# Patient Record
Sex: Female | Born: 1975
Health system: Southern US, Community
[De-identification: ages and names within clinical notes are randomized; demographics above are authoritative.]

## PROBLEM LIST (undated history)

## (undated) DIAGNOSIS — K219 Gastro-esophageal reflux disease without esophagitis: Secondary | ICD-10-CM

## (undated) DIAGNOSIS — E7800 Pure hypercholesterolemia, unspecified: Secondary | ICD-10-CM

## (undated) DIAGNOSIS — G43909 Migraine, unspecified, not intractable, without status migrainosus: Secondary | ICD-10-CM

## (undated) DIAGNOSIS — K589 Irritable bowel syndrome without diarrhea: Secondary | ICD-10-CM

## (undated) DIAGNOSIS — J4520 Mild intermittent asthma, uncomplicated: Secondary | ICD-10-CM

## (undated) HISTORY — DX: Mild intermittent asthma, uncomplicated: J45.20

## (undated) HISTORY — DX: Pure hypercholesterolemia, unspecified: E78.00

## (undated) HISTORY — DX: Irritable bowel syndrome, unspecified: K58.9

## (undated) HISTORY — DX: Migraine, unspecified, not intractable, without status migrainosus: G43.909

## (undated) HISTORY — DX: Gastro-esophageal reflux disease without esophagitis: K21.9

---

## 1994-05-03 HISTORY — PX: WISDOM TOOTH EXTRACTION: SHX21

## 2012-09-27 ENCOUNTER — Other Ambulatory Visit: Payer: Self-pay | Admitting: Adult Health

## 2013-03-22 NOTE — H&P (Signed)
  NTS SOAP Note  Vital Signs:  Vitals as of: 03/22/2013: Systolic 122: Diastolic 79: Heart Rate 68: Temp 97.50F: Height 34ft 6in: Weight 205Lbs 0 Ounces: BMI 33.09  BMI : 33.09 kg/m2  Subjective: This 37 Years 62 Months old Female presents for of hemorrhoidal disease.  Has several external hemorrhoidal skin tags present.  Difficult to keep herself clean.  Occassionally get irritated.  Never has had hemorrhoid surgery.  Review of Symptoms:  Constitutional:  fatigue Head:unremarkable    Eyes:unremarkable   Nose/Mouth/Throat:unremarkable Cardiovascular:  unremarkable   Respiratory:  dyspnea Gastrointestinal:  unremarkable   Genitourinary:unremarkable       joint, neck, back pain dry skin Hematolgic/Lymphatic:unremarkable     Allergic/Immunologic:unremarkable     Past Medical History:    Reviewed   Past Medical History  Surgical History: c sections Medical Problems: none Allergies: nkda Medications: none   Social History:Reviewed  Social History  Preferred Language: English Race:  White Ethnicity: Not Hispanic / Latino Age: 37 Years 6 Months Marital Status:  S   Smoking Status: Never smoker reviewed on 03/22/2013 Functional Status reviewed on mm/dd/yyyy ------------------------------------------------ Bathing: Normal Cooking: Normal Dressing: Normal Driving: Normal Eating: Normal Managing Meds: Normal Oral Care: Normal Shopping: Normal Toileting: Normal Transferring: Normal Walking: Normal Cognitive Status reviewed on mm/dd/yyyy ------------------------------------------------ Attention: Normal Decision Making: Normal Language: Normal Memory: Normal Motor: Normal Perception: Normal Problem Solving: Normal Visual and Spatial: Normal   Family History:  Reviewed  Family Health History Mother, Living; Healthy; healthy Father, Deceased; Healthy; amyloidosis    Objective Information: General:  Well  appearing, well nourished in no distress. Heart:  RRR, no murmur Lungs:    CTA bilaterally, no wheezes, rhonchi, rales.  Breathing unlabored. Abdomen:Soft, NT/ND, no HSM, no masses.   Two prominent external hemorrhoidal skin tags present at 12 and 8 o'clock positions.  No protruding internall hemorrhoids noted.  Assessment:External hemorrhoidal skin tags  Diagnosis &amp; Procedure Smart Code   Plan:Scheduled for excision of hemorrhoidal disease on 04/11/13.   Patient Education:Alternative treatments to surgery were discussed with patient (and family).  Risks and benefits  of procedure including bleeding and recurrence were fully explained to the patient (and family) who gave informed consent. Patient/family questions were addressed.  Follow-up:Pending Surgery

## 2013-03-28 ENCOUNTER — Encounter (HOSPITAL_COMMUNITY): Payer: Self-pay

## 2013-04-04 NOTE — Patient Instructions (Addendum)
Holly Yang  04/04/2013   Your procedure is scheduled on:  04/09/13  Report to [facility] at 10:00 AM.  Call this number if you have problems the morning of surgery: [phone]   Remember:   Do not eat food or drink liquids after midnight.   Take these medicines the morning of surgery with A SIP OF WATER: [name] (birth control).  Do not wear jewelry, make-up or nail polish.  Do not wear lotions, powders, or perfumes.   Do not shave 48 hours prior to surgery. Men may shave face and neck.  Do not bring valuables to the hospital.  [facility] is not responsible for any belongings or valuables.               Contacts, dentures or bridgework may not be worn into surgery.  Leave suitcase in the car. After surgery it may be brought to your room.  For patients admitted to the hospital, discharge time is determined by your treatment team.               Patients discharged the day of surgery will not be allowed to drive home.   Special Instructions: Shower using CHG 2 nights before surgery and the night before surgery.  If you shower the day of surgery use CHG.  Use special wash - you have one bottle of CHG for all showers.  You should use approximately 1/3 of the bottle for each shower.   Please read over the following fact sheets that you were given: Pain Booklet, Surgical Site Infection Prevention, Anesthesia Post-op Instructions and Care and Recovery After Surgery    Hemorrhoidectomy Hemorrhoidectomy is surgery to remove hemorrhoids. Hemorrhoids are veins that have become swollen in the rectum. The rectum is the area from the bottom end of the intestines to the opening where bowel movements leave the body. Hemorrhoids can be uncomfortable. They can cause itching, bleeding and pain if a blood clot forms in them (thrombose). If hemorrhoids are small, surgery may not be needed. But if they cover a larger area, surgery is usually suggested.  LET YOUR CAREGIVER KNOW ABOUT:   Any  allergies.  All medications you are taking, including:  Herbs, eyedrops, over-the-counter medications and creams.  Blood thinners (anticoagulants), aspirin or other drugs that could affect blood clotting.  Use of steroids (by mouth or as creams).  Previous problems with anesthetics, including local anesthetics.  Possibility of pregnancy, if this applies.  Any history of blood clots.  Any history of bleeding or other blood problems.  Previous surgery.  Smoking history.  Other health problems. RISKS AND COMPLICATIONS All surgery carries some risk. However, hemorrhoid surgery usually goes smoothly. Possible complications could include:  Urinary retention.  Bleeding.  Infection.  A painful incision.  A reaction to the anesthesia (this is not common). BEFORE THE PROCEDURE   Stop using aspirin and non-steroidal anti-inflammatory drugs (NSAIDs) for pain relief. This includes prescription drugs and over-the-counter drugs such as ibuprofen and naproxen. Also stop taking vitamin E. If possible, do this two weeks before your surgery.  If you take blood-thinners, ask your healthcare provider when you should stop taking them.  You will probably have blood and urine tests done several days before your surgery.  Do not eat or drink for about 8 hours before the surgery.  Arrive at least an hour before the surgery, or whenever your surgeon recommends. This will give you time to check in and fill out any needed paperwork.  Hemorrhoidectomy is  often an outpatient procedure. This means you will be able to go home the same day. Sometimes, though, people stay overnight in the hospital after the procedure. Ask your surgeon what to expect. Either way, make arrangements in advance for someone to drive you home. PROCEDURE   The preparation:  You will change into a hospital gown.  You will be given an IV. A needle will be inserted in your arm. Medication can flow directly into your body  through this needle.  You might be given an enema to clear your rectum.  Once in the operating room, you will probably lie on your side or be repositioned later to lying on your stomach.  You will be given anesthesia (medication) so you will not feel anything during the surgery. The surgery often is done with local anesthesia (the area near the hemorrhoids will be numb and you will be drowsy but awake). Sometimes, general anesthesia is used (you will be asleep during the procedure).  The procedure:  There are a few different procedures for hemorrhoids. Be sure to ask you surgeon about the procedure, the risks and benefits.  Be sure to ask about what you need to do to take care of the wound, if there is one. AFTER THE PROCEDURE  You will stay in a recovery area until the anesthesia has worn off. Your blood pressure and pulse will be checked every so often.  You may feel a lot of pain in the area of the rectum.  Take all pain medication prescribed by your surgeon. Ask before taking any over-the-counter pain medicines.  Sometimes sitting in a warm bath can help relieve your pain.  To make sure you have bowel movements without straining:  You will probably need to take stool softeners (usually a pill) for a few days.  You should drink 8 to 10 glasses of water each day.  Your activity will be restricted for awhile. Ask your caregiver for a list of what you should and should not do while you recover. Document Released: 02/14/2009 Document Revised: 07/12/2011 Document Reviewed: 02/14/2009 [facility] Patient Information 2014 [place], [place].    PATIENT INSTRUCTIONS POST-ANESTHESIA  IMMEDIATELY FOLLOWING SURGERY:  Do not drive or operate machinery for the first twenty four hours after surgery.  Do not make any important decisions for twenty four hours after surgery or while taking narcotic pain medications or sedatives.  If you develop intractable nausea and vomiting or a severe headache  please notify your doctor immediately.  FOLLOW-UP:  Please make an appointment with your surgeon as instructed. You do not need to follow up with anesthesia unless specifically instructed to do so.  WOUND CARE INSTRUCTIONS (if applicable):  Keep a dry clean dressing on the anesthesia/puncture wound site if there is drainage.  Once the wound has quit draining you may leave it open to air.  Generally you should leave the bandage intact for twenty four hours unless there is drainage.  If the epidural site drains for more than 36-48 hours please call the anesthesia department.  QUESTIONS?:  Please feel free to call your physician or the hospital operator if you have any questions, and they will be happy to assist you.

## 2013-04-05 ENCOUNTER — Encounter (HOSPITAL_COMMUNITY)
Admission: RE | Admit: 2013-04-05 | Discharge: 2013-04-05 | Disposition: A | Discharge status: Home / Self Care | Payer: BC Managed Care – PPO | Source: Ambulatory Visit | Attending: General Surgery | Admitting: General Surgery

## 2013-04-05 ENCOUNTER — Encounter (HOSPITAL_COMMUNITY): Payer: Self-pay

## 2013-04-05 DIAGNOSIS — Z01818 Encounter for other preprocedural examination: Secondary | ICD-10-CM | POA: Insufficient documentation

## 2013-04-05 DIAGNOSIS — Z01812 Encounter for preprocedural laboratory examination: Secondary | ICD-10-CM | POA: Insufficient documentation

## 2013-04-05 LAB — BASIC METABOLIC PANEL
BUN: 12 mg/dL (ref 6–23)
Calcium: 9.1 mg/dL (ref 8.4–10.5)
Chloride: 101 mEq/L (ref 96–112)
Creatinine, Ser: 1.01 mg/dL (ref 0.50–1.10)
GFR calc Af Amer: 81 mL/min — ABNORMAL LOW (ref >90)
GFR calc non Af Amer: 70 mL/min — ABNORMAL LOW (ref >90)
Potassium: 3.8 mEq/L (ref 3.5–5.1)

## 2013-04-05 LAB — HCG, SERUM, QUALITATIVE: Preg, Serum: NEGATIVE

## 2013-04-05 LAB — CBC WITH DIFFERENTIAL/PLATELET
Basophils Absolute: 0 10*3/uL (ref 0.0–0.1)
Basophils Relative: 0 % (ref 0–1)
Hemoglobin: 13 g/dL (ref 12.0–15.0)
Lymphocytes Relative: 31 % (ref 12–46)
MCHC: 33.9 g/dL (ref 30.0–36.0)
Neutro Abs: 4.3 10*3/uL (ref 1.7–7.7)
Neutrophils Relative %: 64 % (ref 43–77)
RDW: 13.5 % (ref 11.5–15.5)
WBC: 6.7 10*3/uL (ref 4.0–10.5)

## 2013-04-11 ENCOUNTER — Encounter (HOSPITAL_COMMUNITY): Payer: BC Managed Care – PPO | Admitting: Anesthesiology

## 2013-04-11 ENCOUNTER — Ambulatory Visit (HOSPITAL_COMMUNITY)
Admission: RE | Admit: 2013-04-11 | Discharge: 2013-04-11 | Disposition: A | Discharge status: Home / Self Care | Payer: BC Managed Care – PPO | Source: Ambulatory Visit | Attending: General Surgery | Admitting: General Surgery

## 2013-04-11 ENCOUNTER — Encounter (HOSPITAL_COMMUNITY): Payer: Self-pay | Admitting: *Deleted

## 2013-04-11 ENCOUNTER — Ambulatory Visit (HOSPITAL_COMMUNITY): Payer: BC Managed Care – PPO | Admitting: Anesthesiology

## 2013-04-11 ENCOUNTER — Encounter (HOSPITAL_COMMUNITY)
Admission: RE | Disposition: A | Discharge status: Home / Self Care | Payer: Self-pay | Source: Ambulatory Visit | Attending: General Surgery

## 2013-04-11 DIAGNOSIS — K644 Residual hemorrhoidal skin tags: Secondary | ICD-10-CM | POA: Insufficient documentation

## 2013-04-11 HISTORY — PX: EXCISION OF SKIN TAG: SHX6270

## 2013-04-11 SURGERY — EXCISION, SKIN TAG
Anesthesia: General | Site: Rectum

## 2013-04-11 MED ORDER — MIDAZOLAM HCL 2 MG/2ML IJ SOLN
INTRAMUSCULAR | Status: AC
  Filled 2013-04-11: qty 2

## 2013-04-11 MED ORDER — CHLORHEXIDINE GLUCONATE 4 % EX LIQD: 1.0000 "application " | Freq: Once | CUTANEOUS | Status: DC

## 2013-04-11 MED ORDER — MIDAZOLAM HCL 2 MG/2ML IJ SOLN
1.0000 mg | INTRAMUSCULAR | Status: DC | PRN
  Administered 2013-04-11: 2 mg via INTRAVENOUS

## 2013-04-11 MED ORDER — ONDANSETRON HCL 4 MG/2ML IJ SOLN
4.0000 mg | Freq: Once | INTRAMUSCULAR | Status: AC
  Administered 2013-04-11: 4 mg via INTRAVENOUS

## 2013-04-11 MED ORDER — FENTANYL CITRATE 0.05 MG/ML IJ SOLN
INTRAMUSCULAR | Status: DC | PRN
  Administered 2013-04-11 (×3): 25 ug via INTRAVENOUS

## 2013-04-11 MED ORDER — LIDOCAINE HCL (PF) 1 % IJ SOLN
INTRAMUSCULAR | Status: AC
  Filled 2013-04-11: qty 5

## 2013-04-11 MED ORDER — METRONIDAZOLE IN NACL 5-0.79 MG/ML-% IV SOLN
INTRAVENOUS | Status: AC
  Filled 2013-04-11: qty 100

## 2013-04-11 MED ORDER — PROPOFOL 10 MG/ML IV BOLUS
INTRAVENOUS | Status: DC | PRN
  Administered 2013-04-11: 125 mg via INTRAVENOUS

## 2013-04-11 MED ORDER — BUPIVACAINE HCL (PF) 0.5 % IJ SOLN
INTRAMUSCULAR | Status: DC | PRN
  Administered 2013-04-11: 4 mL

## 2013-04-11 MED ORDER — FENTANYL CITRATE 0.05 MG/ML IJ SOLN
INTRAMUSCULAR | Status: AC
  Filled 2013-04-11: qty 2

## 2013-04-11 MED ORDER — BUPIVACAINE HCL (PF) 0.5 % IJ SOLN
INTRAMUSCULAR | Status: AC
  Filled 2013-04-11: qty 30

## 2013-04-11 MED ORDER — ONDANSETRON HCL 4 MG/2ML IJ SOLN
INTRAMUSCULAR | Status: AC
  Filled 2013-04-11: qty 2

## 2013-04-11 MED ORDER — 0.9 % SODIUM CHLORIDE (POUR BTL) OPTIME
TOPICAL | Status: DC | PRN
  Administered 2013-04-11: 1000 mL

## 2013-04-11 MED ORDER — FENTANYL CITRATE 0.05 MG/ML IJ SOLN
25.0000 ug | INTRAMUSCULAR | Status: AC
  Administered 2013-04-11 (×2): 25 ug via INTRAVENOUS

## 2013-04-11 MED ORDER — METRONIDAZOLE IN NACL 5-0.79 MG/ML-% IV SOLN
500.0000 mg | INTRAVENOUS | Status: AC
  Administered 2013-04-11: 500 mg via INTRAVENOUS

## 2013-04-11 MED ORDER — OXYCODONE-ACETAMINOPHEN 7.5-325 MG PO TABS: 1.0000 | ORAL_TABLET | ORAL | Status: DC | PRN

## 2013-04-11 MED ORDER — FENTANYL CITRATE 0.05 MG/ML IJ SOLN: 25.0000 ug | INTRAMUSCULAR | Status: DC | PRN

## 2013-04-11 MED ORDER — LACTATED RINGERS IV SOLN
INTRAVENOUS | Status: DC
  Administered 2013-04-11: 08:00:00 via INTRAVENOUS

## 2013-04-11 MED ORDER — LIDOCAINE VISCOUS 2 % MT SOLN
OROMUCOSAL | Status: DC | PRN
  Administered 2013-04-11: 20 mL

## 2013-04-11 MED ORDER — LIDOCAINE VISCOUS 2 % MT SOLN
OROMUCOSAL | Status: AC
  Filled 2013-04-11: qty 15

## 2013-04-11 MED ORDER — PROPOFOL 10 MG/ML IV EMUL
INTRAVENOUS | Status: AC
  Filled 2013-04-11: qty 20

## 2013-04-11 MED ORDER — ONDANSETRON HCL 4 MG/2ML IJ SOLN: 4.0000 mg | Freq: Once | INTRAMUSCULAR | Status: DC | PRN

## 2013-04-11 MED ORDER — LIDOCAINE HCL 1 % IJ SOLN
INTRAMUSCULAR | Status: DC | PRN
  Administered 2013-04-11: 30 mg via INTRADERMAL

## 2013-04-11 MED ORDER — HEMOSTATIC AGENTS (NO CHARGE) OPTIME
TOPICAL | Status: DC | PRN
  Administered 2013-04-11: 1 via TOPICAL

## 2013-04-11 MED ORDER — KETOROLAC TROMETHAMINE 30 MG/ML IJ SOLN
30.0000 mg | Freq: Once | INTRAMUSCULAR | Status: AC
  Administered 2013-04-11: 30 mg via INTRAVENOUS
  Filled 2013-04-11: qty 1

## 2013-04-11 SURGICAL SUPPLY — 30 items
BAG HAMPER (MISCELLANEOUS) ×2 IMPLANT
CLOTH BEACON ORANGE TIMEOUT ST (SAFETY) ×2 IMPLANT
COVER LIGHT HANDLE STERIS (MISCELLANEOUS) ×4 IMPLANT
COVER MAYO STAND XLG (DRAPE) ×2 IMPLANT
DECANTER SPIKE VIAL GLASS SM (MISCELLANEOUS) ×2 IMPLANT
DRAPE PROXIMA HALF (DRAPES) ×2 IMPLANT
ELECT REM PT RETURN 9FT ADLT (ELECTROSURGICAL) ×2
ELECTRODE REM PT RTRN 9FT ADLT (ELECTROSURGICAL) ×1 IMPLANT
GLOVE BIO SURGEON STRL SZ7.5 (GLOVE) ×2 IMPLANT
GLOVE BIOGEL PI IND STRL 7.0 (GLOVE) ×1 IMPLANT
GLOVE BIOGEL PI IND STRL 7.5 (GLOVE) ×1 IMPLANT
GLOVE BIOGEL PI INDICATOR 7.0 (GLOVE) ×1
GLOVE BIOGEL PI INDICATOR 7.5 (GLOVE) ×1
GLOVE ECLIPSE 7.0 STRL STRAW (GLOVE) ×2 IMPLANT
GLOVE EXAM NITRILE LRG STRL (GLOVE) ×2 IMPLANT
GLOVE SS BIOGEL STRL SZ 6.5 (GLOVE) ×1 IMPLANT
GLOVE SUPERSENSE BIOGEL SZ 6.5 (GLOVE) ×1
GOWN STRL REIN XL XLG (GOWN DISPOSABLE) ×4 IMPLANT
HEMOSTAT SURGICEL 4X8 (HEMOSTASIS) ×2 IMPLANT
KIT ROOM TURNOVER AP CYSTO (KITS) ×2 IMPLANT
MANIFOLD NEPTUNE II (INSTRUMENTS) ×2 IMPLANT
NEEDLE HYPO 25X1 1.5 SAFETY (NEEDLE) ×2 IMPLANT
NS IRRIG 1000ML POUR BTL (IV SOLUTION) ×2 IMPLANT
PACK PERI GYN (CUSTOM PROCEDURE TRAY) ×2 IMPLANT
PAD ARMBOARD 7.5X6 YLW CONV (MISCELLANEOUS) ×2 IMPLANT
SET BASIN LINEN APH (SET/KITS/TRAYS/PACK) ×2 IMPLANT
SPONGE GAUZE 4X4 12PLY (GAUZE/BANDAGES/DRESSINGS) ×2 IMPLANT
SURGILUBE 3G PEEL PACK STRL (MISCELLANEOUS) ×2 IMPLANT
SUT SILK 0 FSL (SUTURE) ×2 IMPLANT
SYR CONTROL 10ML LL (SYRINGE) ×2 IMPLANT

## 2013-04-11 NOTE — Anesthesia Preprocedure Evaluation (Signed)
Anesthesia Evaluation  Patient identified by MRN, date of birth, ID band Patient awake    Reviewed: Allergy & Precautions, H&P , NPO status , Patient's Chart, lab work & pertinent test results  History of Anesthesia Complications Negative for: history of anesthetic complications  Airway Mallampati: II  TM Distance: >3 FB     Dental  (+) Teeth Intact   Pulmonary neg pulmonary ROS,  breath sounds clear to auscultation        Cardiovascular negative cardio ROS  Rhythm:Regular Rate:Normal     Neuro/Psych    GI/Hepatic negative GI ROS,   Endo/Other    Renal/GU      Musculoskeletal   Abdominal   Peds  Hematology   Anesthesia Other Findings   Reproductive/Obstetrics                             Anesthesia Physical Anesthesia Plan  ASA: I  Anesthesia Plan: General   Post-op Pain Management:    Induction: Intravenous  Airway Management Planned: LMA  Additional Equipment:   Intra-op Plan:   Post-operative Plan: Extubation in OR  Informed Consent: I have reviewed the patients History and Physical, chart, labs and discussed the procedure including the risks, benefits and alternatives for the proposed anesthesia with the patient or authorized representative who has indicated his/her understanding and acceptance.     Plan Discussed with:   Anesthesia Plan Comments:         Anesthesia Quick Evaluation  

## 2013-04-11 NOTE — Op Note (Signed)
Patient:  Holly Yang  DOB:  08-May-1975  MRN:  [id number]   Preop Diagnosis:  External hemorrhoidal skin tags  Postop Diagnosis:  Same  Procedure:  Excision of hemorrhoidal skin tags  Surgeon:  Franky Macho, M.D.  Anes:  General  Indications:   Patient is a 37 year old white female presents with external hemorrhoidal skin tags the continue to be irritating. The patient now comes the operating room for excision of the hemorrhoidal skin tags. The risks and benefits of the procedure were fully explained to the patient, who gave informed consent.  Procedure note:  The patient was placed the supine position. After general anesthesia was administered, the perineum was prepped and draped using usual sterile technique with Betadine. Surgical site confirmation was performed.  A rectal examination revealed no significant internal hemorrhoidal disease. She had external hemorrhoidal skin tags at the 12:00 and 7:00 positions. Both were excised using the LigaSure. Both were disposed of. No abnormal bleeding was noted at the end of the procedure. 0.5% Sensorcaine was instilled the surrounding peritoneum. Surgicel and Viscous Xylocaine rectal packing was then placed.  All tape and needle counts were correct at the end of the procedure. Patient was awakened and transferred to PACU in stable condition.  Complications:  None  EBL:  None  Specimen:  None

## 2013-04-11 NOTE — Transfer of Care (Signed)
Immediate Anesthesia Transfer of Care Note  Patient: Holly Yang  Procedure(s) Performed: Procedure(s): EXCISION OF EXTERNAL HEMORRHOIDAL SKIN TAGS (N/A)  Patient Location: PACU  Anesthesia Type:General  Level of Consciousness: awake, alert  and oriented  Airway & Oxygen Therapy: Patient Spontanous Breathing and Patient connected to face mask oxygen  Post-op Assessment: Report given to PACU RN  Post vital signs: Reviewed and stable  Complications: No apparent anesthesia complications

## 2013-04-11 NOTE — Anesthesia Procedure Notes (Signed)
Procedure Name: LMA Insertion Date/Time: 04/11/2013 8:45 AM Performed by: Glynn Octave E Pre-anesthesia Checklist: Patient identified, Patient being monitored, Emergency Drugs available, Timeout performed and Suction available Patient Re-evaluated:Patient Re-evaluated prior to inductionOxygen Delivery Method: Circle System Utilized Preoxygenation: Pre-oxygenation with 100% oxygen Intubation Type: IV induction Ventilation: Mask ventilation without difficulty LMA: LMA inserted LMA Size: 4.0 Number of attempts: 1 Placement Confirmation: positive ETCO2 and breath sounds checked- equal and bilateral

## 2013-04-11 NOTE — Anesthesia Postprocedure Evaluation (Signed)
  Anesthesia Post-op Note  Patient: Holly Yang  Procedure(s) Performed: Procedure(s): EXCISION OF EXTERNAL HEMORRHOIDAL SKIN TAGS (N/A)  Patient Location: PACU  Anesthesia Type:General  Level of Consciousness: awake, alert  and oriented  Airway and Oxygen Therapy: Patient Spontanous Breathing and Patient connected to face mask oxygen  Post-op Pain: none  Post-op Assessment: Post-op Vital signs reviewed, Patient's Cardiovascular Status Stable, Respiratory Function Stable, Patent Airway and No signs of Nausea or vomiting  Post-op Vital Signs: Reviewed and stable  Complications: No apparent anesthesia complications

## 2013-04-11 NOTE — Interval H&P Note (Signed)
History and Physical Interval Note:  04/11/2013 8:29 AM  Holly Yang  has presented today for surgery, with the diagnosis of external hemorrhoidal skin tags  The various methods of treatment have been discussed with the patient and family. After consideration of risks, benefits and other options for treatment, the patient has consented to  Procedure(s): EXCISION OF EXTERNAL HEMORRHOIDAL SKIN TAGS (N/A) as a surgical intervention .  The patient's history has been reviewed, patient examined, no change in status, stable for surgery.  I have reviewed the patient's chart and labs.  Questions were answered to the patient's satisfaction.     Franky Macho A

## 2013-04-13 ENCOUNTER — Encounter (HOSPITAL_COMMUNITY): Payer: Self-pay | Admitting: General Surgery

## 2015-07-24 ENCOUNTER — Other Ambulatory Visit (HOSPITAL_COMMUNITY)
Admission: RE | Admit: 2015-07-24 | Discharge: 2015-07-24 | Disposition: A | Discharge status: Home / Self Care | Payer: 59 | Source: Ambulatory Visit | Attending: Internal Medicine | Admitting: Internal Medicine

## 2015-07-24 DIAGNOSIS — G43101 Migraine with aura, not intractable, with status migrainosus: Secondary | ICD-10-CM | POA: Diagnosis present

## 2015-07-24 DIAGNOSIS — R5383 Other fatigue: Secondary | ICD-10-CM | POA: Diagnosis present

## 2015-07-24 DIAGNOSIS — E559 Vitamin D deficiency, unspecified: Secondary | ICD-10-CM | POA: Diagnosis present

## 2015-07-24 LAB — CBC WITH DIFFERENTIAL/PLATELET
Basophils Absolute: 0 10*3/uL (ref 0.0–0.1)
Basophils Relative: 0 %
Eosinophils Absolute: 0.1 10*3/uL (ref 0.0–0.7)
Eosinophils Relative: 1 %
HCT: 41 % (ref 36.0–46.0)
Hemoglobin: 14.2 g/dL (ref 12.0–15.0)
LYMPHS ABS: 2.7 10*3/uL (ref 0.7–4.0)
LYMPHS PCT: 36 %
MCH: 31.6 pg (ref 26.0–34.0)
MCHC: 34.6 g/dL (ref 30.0–36.0)
MCV: 91.1 fL (ref 78.0–100.0)
MONO ABS: 0.4 10*3/uL (ref 0.1–1.0)
Monocytes Relative: 6 %
Neutro Abs: 4.2 10*3/uL (ref 1.7–7.7)
Neutrophils Relative %: 57 %
PLATELETS: 221 10*3/uL (ref 150–400)
RBC: 4.5 MIL/uL (ref 3.87–5.11)
RDW: 12.8 % (ref 11.5–15.5)
WBC: 7.5 10*3/uL (ref 4.0–10.5)

## 2015-07-24 LAB — COMPREHENSIVE METABOLIC PANEL
ALT: 25 U/L (ref 14–54)
AST: 31 U/L (ref 15–41)
Albumin: 4.1 g/dL (ref 3.5–5.0)
Alkaline Phosphatase: 57 U/L (ref 38–126)
Anion gap: 8 (ref 5–15)
BUN: 13 mg/dL (ref 6–20)
CHLORIDE: 102 mmol/L (ref 101–111)
CO2: 28 mmol/L (ref 22–32)
Calcium: 8.9 mg/dL (ref 8.9–10.3)
Creatinine, Ser: 0.85 mg/dL (ref 0.44–1.00)
GFR calc Af Amer: >60 mL/min (ref >60)
Glucose, Bld: 94 mg/dL (ref 65–99)
POTASSIUM: 3.9 mmol/L (ref 3.5–5.1)
Sodium: 138 mmol/L (ref 135–145)
TOTAL PROTEIN: 7.4 g/dL (ref 6.5–8.1)
Total Bilirubin: 0.5 mg/dL (ref 0.3–1.2)

## 2015-07-24 LAB — LIPID PANEL
CHOL/HDL RATIO: 3.7 ratio
Cholesterol: 219 mg/dL — ABNORMAL HIGH (ref 0–200)
HDL: 60 mg/dL (ref >40)
LDL Cholesterol: 130 mg/dL — ABNORMAL HIGH (ref 0–99)
TRIGLYCERIDES: 146 mg/dL (ref <150)
VLDL: 29 mg/dL (ref 0–40)

## 2015-07-24 LAB — T4, FREE: Free T4: 0.81 ng/dL (ref 0.61–1.12)

## 2015-07-24 LAB — TSH: TSH: 3.354 u[IU]/mL (ref 0.350–4.500)

## 2015-07-25 LAB — HEMOGLOBIN A1C
Hgb A1c MFr Bld: 5.5 % (ref 4.8–5.6)
Mean Plasma Glucose: 111 mg/dL

## 2015-07-25 LAB — VITAMIN D 25 HYDROXY (VIT D DEFICIENCY, FRACTURES): VIT D 25 HYDROXY: 47.5 ng/mL (ref 30.0–100.0)

## 2015-07-25 LAB — C-REACTIVE PROTEIN: CRP: 0.6 mg/dL (ref <1.0)

## 2015-07-30 LAB — MISC LABCORP TEST (SEND OUT): Labcorp test code: 600513

## 2015-12-08 ENCOUNTER — Other Ambulatory Visit (HOSPITAL_COMMUNITY): Payer: Self-pay | Admitting: Internal Medicine

## 2015-12-08 DIAGNOSIS — Z1231 Encounter for screening mammogram for malignant neoplasm of breast: Secondary | ICD-10-CM

## 2015-12-18 ENCOUNTER — Ambulatory Visit (HOSPITAL_COMMUNITY): Payer: 59

## 2015-12-29 ENCOUNTER — Other Ambulatory Visit: Payer: Self-pay | Admitting: Adult Health

## 2016-01-12 ENCOUNTER — Ambulatory Visit (HOSPITAL_COMMUNITY)
Admission: RE | Admit: 2016-01-12 | Discharge: 2016-01-12 | Disposition: A | Discharge status: Home / Self Care | Payer: 59 | Source: Ambulatory Visit | Attending: Internal Medicine | Admitting: Internal Medicine

## 2016-01-12 DIAGNOSIS — Z1231 Encounter for screening mammogram for malignant neoplasm of breast: Secondary | ICD-10-CM | POA: Diagnosis not present

## 2016-02-04 ENCOUNTER — Other Ambulatory Visit: Payer: Self-pay | Admitting: Adult Health

## 2016-02-05 ENCOUNTER — Ambulatory Visit (INDEPENDENT_AMBULATORY_CARE_PROVIDER_SITE_OTHER): Payer: 59 | Admitting: Adult Health

## 2016-02-05 ENCOUNTER — Other Ambulatory Visit (HOSPITAL_COMMUNITY)
Admission: RE | Admit: 2016-02-05 | Discharge: 2016-02-05 | Disposition: A | Discharge status: Home / Self Care | Payer: 59 | Source: Ambulatory Visit | Attending: Adult Health | Admitting: Adult Health

## 2016-02-05 ENCOUNTER — Encounter: Payer: Self-pay | Admitting: Adult Health

## 2016-02-05 VITALS — BP 100/72 | HR 76 | Ht 65.5 in | Wt 232.0 lb

## 2016-02-05 DIAGNOSIS — Z1212 Encounter for screening for malignant neoplasm of rectum: Secondary | ICD-10-CM

## 2016-02-05 DIAGNOSIS — Z01419 Encounter for gynecological examination (general) (routine) without abnormal findings: Secondary | ICD-10-CM

## 2016-02-05 DIAGNOSIS — Z1151 Encounter for screening for human papillomavirus (HPV): Secondary | ICD-10-CM | POA: Diagnosis not present

## 2016-02-05 LAB — HEMOCCULT GUIAC POC 1CARD (OFFICE): FECAL OCCULT BLD: NEGATIVE

## 2016-02-05 NOTE — Patient Instructions (Signed)
Physical in 1 year, pap in 3 if normal Mammogram yearly Labs with PCP

## 2016-02-05 NOTE — Progress Notes (Signed)
Patient ID: Holly Yang, female   DOB: 1975/05/14, 41 y.o.   MRN: [id number] History of Present Illness: Holly Yang is a 40 year old white female, single in for a well woman gyn exam and pap. PCP is Dr Maudie Mercury.   Current Medications, Allergies, Past Medical History, Past Surgical History, Family History and Social History were reviewed in [employer] record.     Review of Systems: Patient denies any hearing loss, fatigue, blurred vision, shortness of breath, chest pain, abdominal pain, problems with bowel movements, urination, or intercourse(not having sex). No joint pain or mood swings.Has recently started having migraines, seems to be with periods, is using imitrex.    Physical Exam:BP 100/72 (BP Location: Left Arm, Patient Position: Sitting, Cuff Size: Large)   Pulse 76   Ht 5' 5.5" (1.664 m)   Wt 232 lb (105.2 kg)   LMP 01/05/2016   BMI 38.02 kg/m  General:  Well developed, well nourished, no acute distress Skin:  Warm and dry Neck:  Midline trachea, normal thyroid, good ROM, no lymphadenopathy Lungs; Clear to auscultation bilaterally Breast:  No dominant palpable mass, retraction, or nipple discharge Cardiovascular: Regular rate and rhythm Abdomen:  Soft, non tender, no hepatosplenomegaly Pelvic:  External genitalia is normal in appearance, no lesions.  The vagina is normal in appearance. Urethra has no lesions or masses. The cervix is bulbous,Pap with HPV performed.  Uterus is felt to be normal size, shape, and contour.  No adnexal masses or tenderness noted.Bladder is non tender, no masses felt. Rectal: Good sphincter tone, no polyps, or hemorrhoids felt.  Hemoccult negative. Extremities/musculoskeletal:  No swelling or varicosities noted, no clubbing or cyanosis Psych:  No mood changes, alert and cooperative,seems happy PHQ 2 score 0  Impression:  1. Encounter for gynecological examination with Papanicolaou smear of cervix      Plan: Physical in 1  year, pap in 3 if normal Mammogram yearly Labs with PCPuse condoms if has sex Keep headache log, if really is related to periods, could try estrogen patch or low dose OC

## 2016-02-06 LAB — CYTOLOGY - PAP

## 2016-07-06 DIAGNOSIS — M9901 Segmental and somatic dysfunction of cervical region: Secondary | ICD-10-CM | POA: Diagnosis not present

## 2016-07-06 DIAGNOSIS — M9902 Segmental and somatic dysfunction of thoracic region: Secondary | ICD-10-CM | POA: Diagnosis not present

## 2016-07-06 DIAGNOSIS — M9903 Segmental and somatic dysfunction of lumbar region: Secondary | ICD-10-CM | POA: Diagnosis not present

## 2016-07-20 DIAGNOSIS — M9902 Segmental and somatic dysfunction of thoracic region: Secondary | ICD-10-CM | POA: Diagnosis not present

## 2016-07-20 DIAGNOSIS — M9901 Segmental and somatic dysfunction of cervical region: Secondary | ICD-10-CM | POA: Diagnosis not present

## 2016-07-20 DIAGNOSIS — M9903 Segmental and somatic dysfunction of lumbar region: Secondary | ICD-10-CM | POA: Diagnosis not present

## 2016-07-26 DIAGNOSIS — K589 Irritable bowel syndrome without diarrhea: Secondary | ICD-10-CM | POA: Insufficient documentation

## 2016-07-26 DIAGNOSIS — G43909 Migraine, unspecified, not intractable, without status migrainosus: Secondary | ICD-10-CM | POA: Insufficient documentation

## 2016-07-27 ENCOUNTER — Encounter: Payer: Self-pay | Admitting: Family Medicine

## 2016-07-27 ENCOUNTER — Ambulatory Visit (INDEPENDENT_AMBULATORY_CARE_PROVIDER_SITE_OTHER): Payer: 59 | Admitting: Family Medicine

## 2016-07-27 VITALS — BP 102/68 | HR 84 | Temp 97.1°F | Resp 16 | Ht 66.0 in | Wt 237.1 lb

## 2016-07-27 DIAGNOSIS — G43909 Migraine, unspecified, not intractable, without status migrainosus: Secondary | ICD-10-CM | POA: Diagnosis not present

## 2016-07-27 DIAGNOSIS — G479 Sleep disorder, unspecified: Secondary | ICD-10-CM | POA: Diagnosis not present

## 2016-07-27 DIAGNOSIS — Z7689 Persons encountering health services in other specified circumstances: Secondary | ICD-10-CM | POA: Diagnosis not present

## 2016-07-27 DIAGNOSIS — K580 Irritable bowel syndrome with diarrhea: Secondary | ICD-10-CM | POA: Diagnosis not present

## 2016-07-27 DIAGNOSIS — Z6838 Body mass index (BMI) 38.0-38.9, adult: Secondary | ICD-10-CM | POA: Diagnosis not present

## 2016-07-27 DIAGNOSIS — E6609 Other obesity due to excess calories: Secondary | ICD-10-CM

## 2016-07-27 DIAGNOSIS — R0600 Dyspnea, unspecified: Secondary | ICD-10-CM

## 2016-07-27 DIAGNOSIS — K219 Gastro-esophageal reflux disease without esophagitis: Secondary | ICD-10-CM | POA: Insufficient documentation

## 2016-07-27 HISTORY — DX: Gastro-esophageal reflux disease without esophagitis: K21.9

## 2016-07-27 LAB — LIPID PANEL
Cholesterol: 199 mg/dL (ref <200)
HDL: 61 mg/dL (ref >50)
LDL Cholesterol: 109 mg/dL — ABNORMAL HIGH (ref <100)
Total CHOL/HDL Ratio: 3.3 Ratio (ref <5.0)
Triglycerides: 145 mg/dL (ref <150)
VLDL: 29 mg/dL (ref <30)

## 2016-07-27 LAB — COMPREHENSIVE METABOLIC PANEL
ALK PHOS: 63 U/L (ref 33–115)
ALT: 20 U/L (ref 6–29)
AST: 19 U/L (ref 10–30)
Albumin: 4.1 g/dL (ref 3.6–5.1)
BUN: 14 mg/dL (ref 7–25)
CO2: 27 mmol/L (ref 20–31)
Calcium: 9.5 mg/dL (ref 8.6–10.2)
Chloride: 102 mmol/L (ref 98–110)
Creat: 0.94 mg/dL (ref 0.50–1.10)
GLUCOSE: 97 mg/dL (ref 65–99)
Potassium: 4.4 mmol/L (ref 3.5–5.3)
Sodium: 138 mmol/L (ref 135–146)
TOTAL PROTEIN: 6.9 g/dL (ref 6.1–8.1)
Total Bilirubin: 0.4 mg/dL (ref 0.2–1.2)

## 2016-07-27 LAB — URINALYSIS, ROUTINE W REFLEX MICROSCOPIC
Bilirubin Urine: NEGATIVE
Glucose, UA: NEGATIVE
Hgb urine dipstick: NEGATIVE
KETONES UR: NEGATIVE
Leukocytes, UA: NEGATIVE
NITRITE: NEGATIVE
PROTEIN: NEGATIVE
Specific Gravity, Urine: 1.018 (ref 1.001–1.035)
pH: 6 (ref 5.0–8.0)

## 2016-07-27 LAB — CBC
HCT: 39.8 % (ref 35.0–45.0)
Hemoglobin: 12.9 g/dL (ref 11.7–15.5)
MCH: 28.7 pg (ref 27.0–33.0)
MCHC: 32.4 g/dL (ref 32.0–36.0)
MCV: 88.6 fL (ref 80.0–100.0)
MPV: 10.1 fL (ref 7.5–12.5)
Platelets: 265 10*3/uL (ref 140–400)
RBC: 4.49 MIL/uL (ref 3.80–5.10)
RDW: 13.9 % (ref 11.0–15.0)
WBC: 6.9 10*3/uL (ref 3.8–10.8)

## 2016-07-27 LAB — TSH: TSH: 3.34 mIU/L

## 2016-07-27 NOTE — Patient Instructions (Signed)
drink plenty of water Eat a heart healthy diet Walk every day that you are able Add metamucil ( or generic) daily After one week take twice a day I am referring for home sleep study I am referring to neurology for migraine consult I am ordering a pulmonary function test Labs ordered See me in one month for follow up (40)

## 2016-07-27 NOTE — Progress Notes (Signed)
Chief Complaint  Patient presents with  . Establish Care   Patient is new to establish with this office Old records are requested Pap smear and mammogram are up-to-date She uses albuterol almost a daily basis. She has never had an abnormal PFT or been diagnosed with asthma. She was prescribed this for a vague feeling of shortness of breath. She did not have asthma as a child. Explained to her that she needs a pulmonary function test prior to using this medication. She takes Imitrex several days a month for headache. She has never seen a neurologist been diagnosed with migraine. She gets severe headaches that last for several days at a time. When she has these headaches she vomits. She is self diagnosed as having a migraine. These do not run in her family. They're not related to her menstrual period. She does not have visual symptoms. She is referred to neurology. She has irritable bowel syndrome with diarrhea. She takes medication daily. She has not to try dietary changes, probiotics, or fiber supplementation. It is recommended that she start Metamucil once, then twice a day until she returns to try to get off of the medication. She has GERD. She uses omeprazole when necessary. He complains of difficulty sleeping. She never feels that she sleeps well. She has trouble falling asleep. She wakes up all through the night. She awakens in the morning not feeling rested. She is tired during the day. She's never had a sleep study. This is ordered.   Patient Active Problem List   Diagnosis Date Noted  . Class 2 obesity due to excess calories without serious comorbidity with body mass index (BMI) of 38.0 to 38.9 in adult 07/27/2016  . GERD without esophagitis 07/27/2016  . IBS (irritable bowel syndrome) 07/26/2016  . Migraine headache 07/26/2016    Outpatient Encounter Prescriptions as of 07/27/2016  Medication Sig  . albuterol (PROVENTIL HFA;VENTOLIN HFA) 108 (90 Base) MCG/ACT inhaler Inhale 1 puff  into the lungs every 6 (six) hours as needed for wheezing or shortness of breath.  Marland Kitchen omeprazole (PRILOSEC) 40 MG capsule   . SUMAtriptan (IMITREX) 100 MG tablet Take 100 mg by mouth as needed.   Marland Kitchen VIBERZI 75 MG TABS daily.    No facility-administered encounter medications on file as of 07/27/2016.     Past Medical History:  Diagnosis Date  . GERD without esophagitis 07/27/2016  . Irritable bowel disease    diarrhea  . Migraines     Past Surgical History:  Procedure Laterality Date  . CESAREAN SECTION  2007, 2010   [facility]in, [place], [place], [place], [place]  . EXCISION OF SKIN TAG N/A 04/11/2013   Procedure: EXCISION OF EXTERNAL HEMORRHOIDAL SKIN TAGS;  Surgeon: Jamesetta So, MD;  Location: [facility];  Service: General;  Laterality: N/A;  . [place]    Social History   Social History  . Marital status: Single    Spouse name: N/A  . Number of children: 2  . Years of education: 16   Occupational History  . 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 Smoking status: Never Smoker  . Smokeless tobacco: Never Used  . Alcohol use No  . Drug use: No  . Sexual activity: Not Currently   Other Topics Concern  . Not on file   Social History Narrative   Biology degree / chemistry from [employer]   Lives at home alone  Children live at home with [place] with their fathers       Family History  Problem Relation Age of Onset  . Cancer Paternal Grandfather     skin,liver  . Stroke Paternal Grandmother   . Arthritis Maternal Grandmother   . Other Maternal Grandfather     hemochromatosis  . Cancer Father 34  . Congestive Heart Failure Mother   . Heart disease Mother   . Hypertension Mother     Review of Systems  Constitutional: Negative for chills, fever and weight loss.  HENT: Negative for congestion and hearing loss.   Eyes: Negative for blurred vision and pain.  Respiratory: Positive for shortness of  breath. Negative for cough.   Cardiovascular: Negative for chest pain and leg swelling.  Gastrointestinal: Positive for abdominal pain and diarrhea. Negative for constipation and heartburn.  Genitourinary: Negative for dysuria and frequency.  Musculoskeletal: Negative for falls, joint pain and myalgias.  Neurological: Positive for headaches. Negative for dizziness and seizures.  Psychiatric/Behavioral: Negative for depression. The patient has insomnia. The patient is not nervous/anxious.     BP 102/68 (BP Location: Right Arm, Patient Position: Sitting, Cuff Size: Large)   Pulse 84   Temp 97.1 F (36.2 C) (Temporal)   Resp 16   Ht 5' 6"  (1.676 m)   Wt 237 lb 1.3 oz (107.5 kg)   LMP 07/20/2016 (Exact Date)   SpO2 98%   BMI 38.27 kg/m   Physical Exam  Constitutional: She is oriented to person, place, and time. She appears well-developed and well-nourished.  Overweight  HENT:  Head: Normocephalic and atraumatic.  Right Ear: External ear normal.  Left Ear: External ear normal.  Mouth/Throat: Oropharynx is clear and moist.  Eyes: Conjunctivae are normal. Pupils are equal, round, and reactive to light.  Neck: Normal range of motion. Neck supple. No thyromegaly present.  Cardiovascular: Normal rate, regular rhythm and normal heart sounds.   Pulmonary/Chest: Effort normal and breath sounds normal. No respiratory distress.  Abdominal: Soft. Bowel sounds are normal.  Musculoskeletal: Normal range of motion. She exhibits no edema.  Lymphadenopathy:    She has no cervical adenopathy.  Neurological: She is alert and oriented to person, place, and time. She displays normal reflexes.  Gait normal  Skin: Skin is warm and dry.  Psychiatric: She has a normal mood and affect. Her behavior is normal. Thought content normal.  Nursing note and vitals reviewed.  ASSESSMENT/PLAN:  1. Irritable bowel syndrome with diarrhea   2. Migraine without status migrainosus, not intractable, unspecified  migraine type - Ambulatory referral to Neurology  3. Class 2 obesity due to excess calories without serious comorbidity with body mass index (BMI) of 38.0 to 38.9 in adult - CBC - Comprehensive metabolic panel - Lipid panel - VITAMIN D 25 Hydroxy (Vit-D Deficiency, Fractures) - Urinalysis, Routine w reflex microscopic - TSH  4. GERD without esophagitis  5. Sleep disorder - Home sleep test; Future  6. Dyspnea, unspecified type - Pulmonary function test; Future   Patient Instructions  drink plenty of water Eat a heart healthy diet Walk every day that you are able Add metamucil ( or generic) daily After one week take twice a day I am referring for home sleep study I am referring to neurology for migraine consult I am ordering a pulmonary function test Labs ordered See me in one month for follow up (40)    Raylene Everts, MD

## 2016-07-28 ENCOUNTER — Encounter: Payer: Self-pay | Admitting: Family Medicine

## 2016-07-28 LAB — VITAMIN D 25 HYDROXY (VIT D DEFICIENCY, FRACTURES): VIT D 25 HYDROXY: 34 ng/mL (ref 30–100)

## 2016-08-02 ENCOUNTER — Telehealth: Payer: Self-pay

## 2016-08-02 DIAGNOSIS — J45909 Unspecified asthma, uncomplicated: Secondary | ICD-10-CM

## 2016-08-03 DIAGNOSIS — M9903 Segmental and somatic dysfunction of lumbar region: Secondary | ICD-10-CM | POA: Diagnosis not present

## 2016-08-03 DIAGNOSIS — M9902 Segmental and somatic dysfunction of thoracic region: Secondary | ICD-10-CM | POA: Diagnosis not present

## 2016-08-03 DIAGNOSIS — M9901 Segmental and somatic dysfunction of cervical region: Secondary | ICD-10-CM | POA: Diagnosis not present

## 2016-08-03 NOTE — Telephone Encounter (Signed)
error 

## 2016-08-11 ENCOUNTER — Ambulatory Visit (HOSPITAL_COMMUNITY)
Admission: RE | Admit: 2016-08-11 | Discharge: 2016-08-11 | Disposition: A | Discharge status: Home / Self Care | Payer: 59 | Source: Ambulatory Visit | Attending: Family Medicine | Admitting: Family Medicine

## 2016-08-11 DIAGNOSIS — J45909 Unspecified asthma, uncomplicated: Secondary | ICD-10-CM | POA: Insufficient documentation

## 2016-08-11 MED ORDER — ALBUTEROL SULFATE (2.5 MG/3ML) 0.083% IN NEBU
2.5000 mg | INHALATION_SOLUTION | Freq: Once | RESPIRATORY_TRACT | Status: AC
  Administered 2016-08-11: 2.5 mg via RESPIRATORY_TRACT

## 2016-08-15 LAB — PULMONARY FUNCTION TEST
DL/VA % pred: 109 %
DL/VA: 5.39 ml/min/mmHg/L
DLCO cor % pred: 95 %
DLCO cor: 24.51 ml/min/mmHg
DLCO unc % pred: 95 %
DLCO unc: 24.51 ml/min/mmHg
FEF 25-75 Post: 1.76 L/sec
FEF 25-75 Pre: 2.13 L/sec
FEF2575-%Change-Post: -17 %
FEF2575-%PRED-POST: 56 %
FEF2575-%PRED-PRE: 68 %
FEV1-%Change-Post: -4 %
FEV1-%PRED-PRE: 68 %
FEV1-%Pred-Post: 65 %
FEV1-PRE: 2.11 L
FEV1-Post: 2.02 L
FEV1FVC-%Change-Post: 2 %
FEV1FVC-%PRED-PRE: 97 %
FEV6-%CHANGE-POST: -6 %
FEV6-%Pred-Post: 66 %
FEV6-%Pred-Pre: 70 %
FEV6-POST: 2.47 L
FEV6-Pre: 2.64 L
FEV6FVC-%PRED-PRE: 102 %
FEV6FVC-%Pred-Post: 102 %
FVC-%Change-Post: -6 %
FVC-%Pred-Post: 65 %
FVC-%Pred-Pre: 69 %
FVC-Post: 2.47 L
FVC-Pre: 2.64 L
PRE FEV6/FVC RATIO: 100 %
Post FEV1/FVC ratio: 82 %
Post FEV6/FVC ratio: 100 %
Pre FEV1/FVC ratio: 80 %
RV % PRED: 106 %
RV: 1.78 L
TLC % pred: 94 %
TLC: 4.9 L

## 2016-08-17 DIAGNOSIS — M9902 Segmental and somatic dysfunction of thoracic region: Secondary | ICD-10-CM | POA: Diagnosis not present

## 2016-08-17 DIAGNOSIS — M9901 Segmental and somatic dysfunction of cervical region: Secondary | ICD-10-CM | POA: Diagnosis not present

## 2016-08-17 DIAGNOSIS — M9903 Segmental and somatic dysfunction of lumbar region: Secondary | ICD-10-CM | POA: Diagnosis not present

## 2016-08-18 ENCOUNTER — Encounter: Payer: Self-pay | Admitting: Neurology

## 2016-08-18 ENCOUNTER — Ambulatory Visit (INDEPENDENT_AMBULATORY_CARE_PROVIDER_SITE_OTHER): Payer: 59 | Admitting: Neurology

## 2016-08-18 VITALS — BP 113/67 | HR 78 | Ht 66.0 in | Wt 234.0 lb

## 2016-08-18 DIAGNOSIS — R51 Headache: Secondary | ICD-10-CM | POA: Diagnosis not present

## 2016-08-18 DIAGNOSIS — R519 Headache, unspecified: Secondary | ICD-10-CM

## 2016-08-18 DIAGNOSIS — G43909 Migraine, unspecified, not intractable, without status migrainosus: Secondary | ICD-10-CM | POA: Diagnosis not present

## 2016-08-18 MED ORDER — TOPIRAMATE 100 MG PO TABS: 100.0000 mg | ORAL_TABLET | Freq: Two times a day (BID) | ORAL | 11 refills | Status: DC

## 2016-08-18 MED ORDER — ONDANSETRON 4 MG PO TBDP: 4.0000 mg | ORAL_TABLET | Freq: Three times a day (TID) | ORAL | 11 refills | Status: DC | PRN

## 2016-08-18 MED ORDER — RIZATRIPTAN BENZOATE 10 MG PO TBDP: 10.0000 mg | ORAL_TABLET | ORAL | 11 refills | Status: DC | PRN

## 2016-08-18 MED ORDER — DIAZEPAM 5 MG PO TABS: 5.0000 mg | ORAL_TABLET | Freq: Four times a day (QID) | ORAL | 0 refills | Status: DC | PRN

## 2016-08-18 NOTE — Progress Notes (Signed)
PATIENT: Holly Yang DOB: 01-Mar-1976  Chief Complaint  Patient presents with  . Migraine    Reports having some weeks without a headache then she may have 4-5 headache days the following week.  She uses sumatriptan, with relief, for her more severe headaches. She sometimes has nausea, vomiting and light sensitivity.  . PCP    Raylene Everts, MD     HISTORICAL  Holly Yang is a 41 years old right-handed female, seen in refer by her primary care physician Dr.Yvonne Lysle Morales, for evaluation of chronic migraine headaches, sleeping issues, initial evaluation was on August 18 2016.  She had a history of substance abuse, but quit long time ago, she denies history of migraine headaches, she started a new job in March 2017, since that time, she began to notice gradual onset of headache, it used to happen around 2:00 in the afternoon, gradually building up, until she threw up at evening time, sometimes woke up with her headaches. Bilateral temporal frontal region, spreading to occipital region, pressure pain, during intense pain, she also has pounding headache with associated light noise sensitivity,  She also complains of difficulty sleeping, difficulty falling to sleep, noticed only for 5 hours of sleep time since she wear a fitness band in November 2017.  REVIEW OF SYSTEMS: Full 14 system review of systems performed and notable only for weight gain, chest pain, shortness of breath, diarrhea, increased thirst, joint pain, cramps, allergy, headaches, numbness, weakness, insomnia, not enough sleep, disinterested in activities.  ALLERGIES: No Known Allergies  HOME MEDICATIONS: Current Outpatient Prescriptions  Medication Sig Dispense Refill  . albuterol (PROVENTIL HFA;VENTOLIN HFA) 108 (90 Base) MCG/ACT inhaler Inhale 1 puff into the lungs every 6 (six) hours as needed for wheezing or shortness of breath.    Marland Kitchen omeprazole (PRILOSEC) 40 MG capsule Take 40 mg by mouth as needed.       . SUMAtriptan (IMITREX) 100 MG tablet Take 100 mg by mouth as needed.     Marland Kitchen VIBERZI 75 MG TABS Take 75 mg by mouth as needed.      No current facility-administered medications for this visit.     PAST MEDICAL HISTORY: Past Medical History:  Diagnosis Date  . GERD without esophagitis 07/27/2016  . Irritable bowel disease    diarrhea  . Migraines     PAST SURGICAL HISTORY: Past Surgical History:  Procedure Laterality Date  . CESAREAN SECTION  2007, 2010   Cecille Rubin, [place], [place], [place], [place]  . EXCISION OF SKIN TAG N/A 04/11/2013   Procedure: EXCISION OF EXTERNAL HEMORRHOIDAL SKIN TAGS;  Surgeon: Jamesetta So, MD;  Location: [facility];  Service: General;  Laterality: N/A;  . WISDOM TOOTH EXTRACTION  1996    FAMILY HISTORY: Family History  Problem Relation Age of Onset  . Cancer Paternal Grandfather     skin,liver  . Stroke Paternal Grandmother   . Arthritis Maternal Grandmother   . Other Maternal Grandfather     hemochromatosis  . Other Father 70    Amyloidosis  . Congestive Heart Failure Mother   . Heart disease Mother   . Hypertension Mother     SOCIAL HISTORY:  Social History   Social History  . Marital status: Single    Spouse name: N/A  . Number of children: 2  . Years of education: Bachelors   Occupational History  . 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  Smoking status: Former Research scientist (life sciences)  . Smokeless tobacco: Never Used     Comment: Quit 15 years ago  . Alcohol use No     Comment: Quit 5 years ago  . Drug use: No     Comment: Quit 15 years ago  . Sexual activity: Not Currently   Other Topics Concern  . Not on file   Social History Narrative   Biology degree / chemistry from [employer]   Lives at home alone   Children live at home with [place] with their fathers, home with her on the weekends.   Right-handed   3-4 cups caffeine per day.        PHYSICAL EXAM   Vitals:   08/18/16 0834   BP: 113/67  Pulse: 78  Weight: 234 lb (106.1 kg)  Height: 5' 6"  (1.676 m)    Not recorded      Body mass index is 37.77 kg/m.  PHYSICAL EXAMNIATION:  Gen: NAD, conversant, well nourised, obese, well groomed                     Cardiovascular: Regular rate rhythm, no peripheral edema, warm, nontender. Eyes: Conjunctivae clear without exudates or hemorrhage Neck: Supple, no carotid bruits. Pulmonary: Clear to auscultation bilaterally   NEUROLOGICAL EXAM:  MENTAL STATUS: Speech:    Speech is normal; fluent and spontaneous with normal comprehension.  Cognition:     Orientation to time, place and person     Normal recent and remote memory     Normal Attention span and concentration     Normal Language, naming, repeating,spontaneous speech     Fund of knowledge   CRANIAL NERVES: CN II: Visual fields are full to confrontation. Fundoscopic exam is normal with sharp discs and no vascular changes. Pupils are round equal and briskly reactive to light. CN III, IV, VI: extraocular movement are normal. No ptosis. CN V: Facial sensation is intact to pinprick in all 3 divisions bilaterally. Corneal responses are intact.  CN VII: Face is symmetric with normal eye closure and smile. CN VIII: Hearing is normal to rubbing fingers CN IX, X: Palate elevates symmetrically. Phonation is normal. CN XI: Head turning and shoulder shrug are intact CN XII: Tongue is midline with normal movements and no atrophy.  MOTOR: There is no pronator drift of out-stretched arms. Muscle bulk and tone are normal. Muscle strength is normal.  REFLEXES: Reflexes are 2+ and symmetric at the biceps, triceps, knees, and ankles. Plantar responses are flexor.  SENSORY: Intact to light touch, pinprick, positional sensation and vibratory sensation are intact in fingers and toes.  COORDINATION: Rapid alternating movements and fine finger movements are intact. There is no dysmetria on finger-to-nose and  heel-knee-shin.    GAIT/STANCE: Posture is normal. Gait is steady with normal steps, base, arm swing, and turning. Heel and toe walking are normal. Tandem gait is normal.  Romberg is absent.   DIAGNOSTIC DATA (LABS, IMAGING, TESTING) - I reviewed patient records, labs, notes, testing and imaging myself where available.   ASSESSMENT AND PLAN  ZORIA Yang is a 41 y.o. female   Chronic migraine headaches,  chronic insomnia New-onset persistent headaches  MRI of the brain without contrast,  Proceed with preventive medications Topamax, titrating to 100 mg twice a day  Maxalt dissolvable, Zofran as needed   Marcial Pacas, M.D. Ph.D.  [facility] [address], [place], [place] [address] Ph: [phone] Fax: [phone]  CC: Raylene Everts, MD

## 2016-08-26 DIAGNOSIS — M9902 Segmental and somatic dysfunction of thoracic region: Secondary | ICD-10-CM | POA: Diagnosis not present

## 2016-08-26 DIAGNOSIS — M9901 Segmental and somatic dysfunction of cervical region: Secondary | ICD-10-CM | POA: Diagnosis not present

## 2016-08-26 DIAGNOSIS — M9903 Segmental and somatic dysfunction of lumbar region: Secondary | ICD-10-CM | POA: Diagnosis not present

## 2016-08-30 ENCOUNTER — Ambulatory Visit (INDEPENDENT_AMBULATORY_CARE_PROVIDER_SITE_OTHER): Payer: 59 | Admitting: Family Medicine

## 2016-08-30 ENCOUNTER — Encounter: Payer: Self-pay | Admitting: Family Medicine

## 2016-08-30 VITALS — BP 124/80 | HR 88 | Temp 98.4°F | Resp 18 | Ht 66.0 in | Wt 241.0 lb

## 2016-08-30 DIAGNOSIS — G43909 Migraine, unspecified, not intractable, without status migrainosus: Secondary | ICD-10-CM

## 2016-08-30 DIAGNOSIS — E6609 Other obesity due to excess calories: Secondary | ICD-10-CM

## 2016-08-30 DIAGNOSIS — Z6838 Body mass index (BMI) 38.0-38.9, adult: Secondary | ICD-10-CM | POA: Diagnosis not present

## 2016-08-30 DIAGNOSIS — F5104 Psychophysiologic insomnia: Secondary | ICD-10-CM

## 2016-08-30 DIAGNOSIS — Z0000 Encounter for general adult medical examination without abnormal findings: Secondary | ICD-10-CM | POA: Diagnosis not present

## 2016-08-30 DIAGNOSIS — J4520 Mild intermittent asthma, uncomplicated: Secondary | ICD-10-CM | POA: Diagnosis not present

## 2016-08-30 HISTORY — DX: Mild intermittent asthma, uncomplicated: J45.20

## 2016-08-30 NOTE — Patient Instructions (Addendum)
Walk / exercise every day that you are able  See me in six months  Call sooner for problems

## 2016-08-30 NOTE — Progress Notes (Signed)
Chief Complaint  Patient presents with  . Annual Exam  Patient is here for follow-up. She did go for the neurology consult. She did not yet start the medicines prescribed. She read the list of side effects and became afraid. I discussed with her these medications are safe and effective. If she is concerned about side effects, she should take the medication at perhaps half dose and gradually work up. I reassured her that the correct medications were prescribed for her. She did have a pulmonary function test. She does have mild asthma. She uses an albuterol inhaler prior to exercise. Patient has not yet had a sleep study. She suffers from obesity. She exercises intermittently. She states that she knows what to eat, low-fat low carb, moderate portions. She does have difficulty with willpower. Her GERD is controlled on medication. Her Pap is up-to-date one year ago. Her mammogram was done in September 2017. We discussed the lab work from last visit. Everything was normal or as expected. She has mild hyperlipidemia with an LDL of 109, but her HDL is good.   Patient Active Problem List   Diagnosis Date Noted  . Chronic insomnia 08/30/2016  . Asthma in adult, mild intermittent, uncomplicated 24/40/1027  . Class 2 obesity due to excess calories without serious comorbidity with body mass index (BMI) of 38.0 to 38.9 in adult 07/27/2016  . GERD without esophagitis 07/27/2016  . IBS (irritable bowel syndrome) 07/26/2016  . Migraine headache 07/26/2016    Outpatient Encounter Prescriptions as of 08/30/2016  Medication Sig  . albuterol (PROVENTIL HFA;VENTOLIN HFA) 108 (90 Base) MCG/ACT inhaler Inhale 1 puff into the lungs every 6 (six) hours as needed for wheezing or shortness of breath.  . diazepam (VALIUM) 5 MG tablet Take 1 tablet (5 mg total) by mouth every 6 (six) hours as needed for anxiety. As needed for MRI  . omeprazole (PRILOSEC) 40 MG capsule Take 40 mg by mouth as needed.   .  ondansetron (ZOFRAN ODT) 4 MG disintegrating tablet Take 1 tablet (4 mg total) by mouth every 8 (eight) hours as needed.  . rizatriptan (MAXALT-MLT) 10 MG disintegrating tablet Take 1 tablet (10 mg total) by mouth as needed. May repeat in 2 hours if needed  . SUMAtriptan (IMITREX) 100 MG tablet Take 100 mg by mouth as needed.   Marland Kitchen VIBERZI 75 MG TABS Take 75 mg by mouth as needed.   . topiramate (TOPAMAX) 100 MG tablet Take 1 tablet (100 mg total) by mouth 2 (two) times daily. (Patient not taking: Reported on 08/30/2016)   No facility-administered encounter medications on file as of 08/30/2016.     No Known Allergies  Review of Systems  Constitutional: Negative for activity change, appetite change and unexpected weight change.  HENT: Negative for congestion, dental problem, postnasal drip and rhinorrhea.   Eyes: Negative for redness and visual disturbance.  Respiratory: Negative for cough and shortness of breath.   Cardiovascular: Negative for chest pain, palpitations and leg swelling.  Gastrointestinal: Negative for abdominal pain, constipation and diarrhea.  Genitourinary: Negative for difficulty urinating, frequency and menstrual problem.  Musculoskeletal: Negative for arthralgias and back pain.  Neurological: Positive for headaches. Negative for dizziness.  Psychiatric/Behavioral: Negative for dysphoric mood and sleep disturbance. The patient is not nervous/anxious.     BP 124/80 (BP Location: Right Arm, Patient Position: Sitting, Cuff Size: Large)   Pulse 88   Temp 98.4 F (36.9 C) (Temporal)   Resp 18   Ht 5' 6"  (1.676  m)   Wt 241 lb 0.6 oz (109.3 kg)   LMP 08/16/2016 (Exact Date)   SpO2 96%   BMI 38.90 kg/m   Physical Exam  BP 124/80 (BP Location: Right Arm, Patient Position: Sitting, Cuff Size: Large)   Pulse 88   Temp 98.4 F (36.9 C) (Temporal)   Resp 18   Ht 5' 6"  (1.676 m)   Wt 241 lb 0.6 oz (109.3 kg)   LMP 08/16/2016 (Exact Date)   SpO2 96%   BMI 38.90 kg/m    General Appearance:    Alert, cooperative, no distress, appears stated age. Obese   Head:    Normocephalic, without obvious abnormality, atraumatic  Eyes:    PERRL, conjunctiva/corneas clear, EOM's intact, fundi    benign, both eyes  Ears:    Normal TM's and external ear canals, both ears  Nose:   Nares normal, septum midline, mucosa normal, no drainage    or sinus tenderness  Throat:   Lips, mucosa, and tongue normal; teeth and gums normal  Neck:   Supple, symmetrical, trachea midline, no adenopathy;    thyroid:  no enlargement/tenderness/nodules; no carotid   bruit  Back:     Symmetric, no curvature, ROM normal, no CVA tenderness  Lungs:     Clear to auscultation bilaterally, respirations unlabored  Chest Wall:    No tenderness or deformity   Heart:    Regular rate and rhythm, S1 and S2 normal, no murmur, rub   or gallop  Breast Exam:    No tenderness, masses, or nipple abnormality  Abdomen:     Soft, non-tender, bowel sounds active all four quadrants,    no masses, no organomegaly  Extremities:   Extremities normal, atraumatic, no cyanosis or edema  Pulses:   2+ and symmetric all extremities  Skin:   Skin color, texture, turgor normal, no rashes or lesions. Fair skinned. Discussed sunscreen   Lymph nodes:   Cervical, supraclavicular, and axillary nodes normal  Neurologic:   Normal strength, sensation and reflexes    throughout     ASSESSMENT/PLAN:  1. PE (physical exam), annual   2. Migraine without status migrainosus, not intractable, unspecified migraine type Under care of neurology.  3. Class 2 obesity due to excess calories without serious comorbidity with body mass index (BMI) of 38.0 to 38.9 in adult Diet and exercises reviewed with patient.  4. Chronic insomnia Possible OSA. Under care of neurology.  5. Asthma in adult, mild intermittent, uncomplicated Intermittently symptomatic.   Patient Instructions  Walk / exercise every day that you are able  See me  in six months  Call sooner for problems   Raylene Everts, MD

## 2016-09-10 DIAGNOSIS — M9903 Segmental and somatic dysfunction of lumbar region: Secondary | ICD-10-CM | POA: Diagnosis not present

## 2016-09-10 DIAGNOSIS — M9902 Segmental and somatic dysfunction of thoracic region: Secondary | ICD-10-CM | POA: Diagnosis not present

## 2016-09-10 DIAGNOSIS — M9901 Segmental and somatic dysfunction of cervical region: Secondary | ICD-10-CM | POA: Diagnosis not present

## 2016-09-16 DIAGNOSIS — M9903 Segmental and somatic dysfunction of lumbar region: Secondary | ICD-10-CM | POA: Diagnosis not present

## 2016-09-16 DIAGNOSIS — M9902 Segmental and somatic dysfunction of thoracic region: Secondary | ICD-10-CM | POA: Diagnosis not present

## 2016-09-16 DIAGNOSIS — M9901 Segmental and somatic dysfunction of cervical region: Secondary | ICD-10-CM | POA: Diagnosis not present

## 2016-10-07 ENCOUNTER — Telehealth: Payer: Self-pay | Admitting: Neurology

## 2016-10-07 NOTE — Telephone Encounter (Signed)
Pt said she took 1/2 tab topiramate (TOPAMAX) 100 MG tablet which made her sick, she missed work and did not take it again. Insurance would not cover rizatriptan (MAXALT-MLT) 10 MG disintegrating tablet. She did pick up RX for ondansetron (ZOFRAN ODT) 4 MG disintegrating tablet but she has not tried it yet. Does she need to keep appt on 6/11?

## 2016-10-07 NOTE — Telephone Encounter (Signed)
Spoke to patient - she has Zofran and Imitrex at home for migraine treatment.  Her insurance will only cover #4 rizatriptan at a high co-pay.  She is going to print out a goodrx.com coupon to take to the pharmacy for a lower price.  She was unable to tolerate Topamax and unwilling to try it again due to the violent reaction she had to such a low dose.  Reports having nausea, vomiting, headache, fatigue and mental slowly with just 38m.  She will keep her appt on 10/11/16 to discuss alternate treatment options.

## 2016-10-11 ENCOUNTER — Encounter: Payer: Self-pay | Admitting: Neurology

## 2016-10-11 ENCOUNTER — Ambulatory Visit (INDEPENDENT_AMBULATORY_CARE_PROVIDER_SITE_OTHER): Payer: 59 | Admitting: Neurology

## 2016-10-11 VITALS — BP 111/78 | HR 87 | Wt 238.0 lb

## 2016-10-11 DIAGNOSIS — G43909 Migraine, unspecified, not intractable, without status migrainosus: Secondary | ICD-10-CM | POA: Diagnosis not present

## 2016-10-11 NOTE — Progress Notes (Signed)
PATIENT: Holly Yang DOB: 02/09/1976  Chief Complaint  Patient presents with  . Follow-up    Migraine follow up     HISTORICAL  Holly Yang is a 41 years old right-handed female, seen in refer by her primary care physician Dr.Yvonne Lysle Morales, for evaluation of chronic migraine headaches, sleeping issues, initial evaluation was on August 18 2016.  She had a history of substance abuse, but quit long time ago, she denies history of migraine headaches, she started a new job in March 2017, since that time, she began to notice gradual onset of headache, it used to happen around 2:00 in the afternoon, gradually building up, until she threw up at evening time, sometimes woke up with her headaches. Bilateral temporal frontal region, spreading to occipital region, pressure pain, during intense pain, she also has pounding headache with associated light noise sensitivity,  She also complains of difficulty sleeping, difficulty falling to sleep, noticed only for 5 hours of sleep time since she wear a fitness band in November 2017.  UPDATE October 11 2016: She could not tolerate topamax, which has made her very sleepy, but she complains of increased headache after she took topamax, She does not want to go back on Topamax other prescription preventive medication at this point  She has never tried other preventive medication.  She is now taking Sumatriptan 100 mg night tablets a times each month, her headache lasted about one hour to 6 hours.  REVIEW OF SYSTEMS: Full 14 system review of systems performed and notable only for back pain, dizziness  ALLERGIES: No Known Allergies  HOME MEDICATIONS: Current Outpatient Prescriptions  Medication Sig Dispense Refill  . albuterol (PROVENTIL HFA;VENTOLIN HFA) 108 (90 Base) MCG/ACT inhaler Inhale 1 puff into the lungs every 6 (six) hours as needed for wheezing or shortness of breath.    Marland Kitchen omeprazole (PRILOSEC) 40 MG capsule Take 40 mg by mouth as  needed.     . ondansetron (ZOFRAN ODT) 4 MG disintegrating tablet Take 1 tablet (4 mg total) by mouth every 8 (eight) hours as needed. 30 tablet 11  . SUMAtriptan (IMITREX) 100 MG tablet Take 100 mg by mouth as needed.      No current facility-administered medications for this visit.     PAST MEDICAL HISTORY: Past Medical History:  Diagnosis Date  . Asthma in adult, mild intermittent, uncomplicated 7/90/2409  . GERD without esophagitis 07/27/2016  . Irritable bowel disease    diarrhea  . Migraines     PAST SURGICAL HISTORY: Past Surgical History:  Procedure Laterality Date  . CESAREAN SECTION  2007, 2010   Holly Yang, [place], [place], [place], [place]  . EXCISION OF SKIN TAG N/A 04/11/2013   Procedure: EXCISION OF EXTERNAL HEMORRHOIDAL SKIN TAGS;  Surgeon: Jamesetta So, MD;  Location: [facility];  Service: General;  Laterality: N/A;  . WISDOM TOOTH EXTRACTION  1996    FAMILY HISTORY: Family History  Problem Relation Age of Onset  . Cancer Paternal Grandfather        skin,liver  . Stroke Paternal Grandmother   . Arthritis Maternal Grandmother   . Other Maternal Grandfather        hemochromatosis  . Other Father 21       Amyloidosis  . Congestive Heart Failure Mother   . Heart disease Mother   . Hypertension Mother     SOCIAL HISTORY:  Social History   Social History  . Marital status: Single  Spouse name: N/A  . Number of children: 2  . Years of education: Bachelors   Occupational History  . 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 Smoking status: Former Research scientist (life sciences)  . Smokeless tobacco: Never Used     Comment: Quit 15 years ago  . Alcohol use No     Comment: Quit 5 years ago  . Drug use: No     Comment: Quit 15 years ago  . Sexual activity: Not Currently   Other Topics Concern  . Not on file   Social History Narrative   Biology degree / chemistry from [employer]   Lives at home alone   Children live at home with  [place] with their fathers, home with her on the weekends.   Right-handed   3-4 cups caffeine per day.        PHYSICAL EXAM   Vitals:   10/11/16 1553  BP: 111/78  Pulse: 87  Weight: 238 lb (108 kg)    Not recorded      Body mass index is 38.41 kg/m.  PHYSICAL EXAMNIATION:  Gen: NAD, conversant, well nourised, obese, well groomed                     Cardiovascular: Regular rate rhythm, no peripheral edema, warm, nontender. Eyes: Conjunctivae clear without exudates or hemorrhage Neck: Supple, no carotid bruits. Pulmonary: Clear to auscultation bilaterally   NEUROLOGICAL EXAM:  MENTAL STATUS: Speech:    Speech is normal; fluent and spontaneous with normal comprehension.  Cognition:     Orientation to time, place and person     Normal recent and remote memory     Normal Attention span and concentration     Normal Language, naming, repeating,spontaneous speech     Fund of knowledge   CRANIAL NERVES: CN II: Visual fields are full to confrontation. Fundoscopic exam is normal with sharp discs and no vascular changes. Pupils are round equal and briskly reactive to light. CN III, IV, VI: extraocular movement are normal. No ptosis. CN V: Facial sensation is intact to pinprick in all 3 divisions bilaterally. Corneal responses are intact.  CN VII: Face is symmetric with normal eye closure and smile. CN VIII: Hearing is normal to rubbing fingers CN IX, X: Palate elevates symmetrically. Phonation is normal. CN XI: Head turning and shoulder shrug are intact CN XII: Tongue is midline with normal movements and no atrophy.  MOTOR: There is no pronator drift of out-stretched arms. Muscle bulk and tone are normal. Muscle strength is normal.  REFLEXES: Reflexes are 2+ and symmetric at the biceps, triceps, knees, and ankles. Plantar responses are flexor.  SENSORY: Intact to light touch, pinprick, positional sensation and vibratory sensation are intact in fingers and  toes.  COORDINATION: Rapid alternating movements and fine finger movements are intact. There is no dysmetria on finger-to-nose and heel-knee-shin.    GAIT/STANCE: Posture is normal. Gait is steady with normal steps, base, arm swing, and turning. Heel and toe walking are normal. Tandem gait is normal.  Romberg is absent.   DIAGNOSTIC DATA (LABS, IMAGING, TESTING) - I reviewed patient records, labs, notes, testing and imaging myself where available.   ASSESSMENT AND PLAN  ALIAYAH TYER is a 41 y.o. female   Chronic migraine headaches,  chronic insomnia New-onset persistent headaches MRI of the brain without contrast, was ordered, but she is concerned about high co-pay, Overall her migraine has improved, will try over-the-counter preventive medications  magnesium oxide 400 mg twice a day, riboflavin 100 mg twice a day  Sumatriptan 100 mg as needed, may add on Zofran, and Aleve as needed for abortive treatment   Marcial Pacas, M.D. Ph.D.  [facility] [address], [place], [place] [address] Ph: [phone] Fax: [phone]  CC: Raylene Everts, MD

## 2016-10-11 NOTE — Patient Instructions (Signed)
Magnesium oxide 400 mg twice a day Riboflavin 100 mg twice a day=Vit B2  You may take sumatriptan 100 mg as needed together with Zofran, even Aleve 220 mg 1-2 tablets as needed for migraine

## 2016-10-28 DIAGNOSIS — G43009 Migraine without aura, not intractable, without status migrainosus: Secondary | ICD-10-CM | POA: Diagnosis not present

## 2016-10-28 DIAGNOSIS — M9902 Segmental and somatic dysfunction of thoracic region: Secondary | ICD-10-CM | POA: Diagnosis not present

## 2016-10-28 DIAGNOSIS — M9901 Segmental and somatic dysfunction of cervical region: Secondary | ICD-10-CM | POA: Diagnosis not present

## 2016-11-18 DIAGNOSIS — M9902 Segmental and somatic dysfunction of thoracic region: Secondary | ICD-10-CM | POA: Diagnosis not present

## 2016-11-18 DIAGNOSIS — G43009 Migraine without aura, not intractable, without status migrainosus: Secondary | ICD-10-CM | POA: Diagnosis not present

## 2016-11-18 DIAGNOSIS — M9901 Segmental and somatic dysfunction of cervical region: Secondary | ICD-10-CM | POA: Diagnosis not present

## 2016-11-30 DIAGNOSIS — L821 Other seborrheic keratosis: Secondary | ICD-10-CM | POA: Diagnosis not present

## 2016-11-30 DIAGNOSIS — D1801 Hemangioma of skin and subcutaneous tissue: Secondary | ICD-10-CM | POA: Diagnosis not present

## 2016-11-30 DIAGNOSIS — L815 Leukoderma, not elsewhere classified: Secondary | ICD-10-CM | POA: Diagnosis not present

## 2016-12-07 DIAGNOSIS — M9902 Segmental and somatic dysfunction of thoracic region: Secondary | ICD-10-CM | POA: Diagnosis not present

## 2016-12-07 DIAGNOSIS — G43009 Migraine without aura, not intractable, without status migrainosus: Secondary | ICD-10-CM | POA: Diagnosis not present

## 2016-12-07 DIAGNOSIS — M9901 Segmental and somatic dysfunction of cervical region: Secondary | ICD-10-CM | POA: Diagnosis not present

## 2017-02-09 ENCOUNTER — Ambulatory Visit: Payer: 59 | Admitting: Nurse Practitioner

## 2017-03-02 ENCOUNTER — Ambulatory Visit (INDEPENDENT_AMBULATORY_CARE_PROVIDER_SITE_OTHER): Payer: 59 | Admitting: Family Medicine

## 2017-03-02 ENCOUNTER — Encounter: Payer: Self-pay | Admitting: Family Medicine

## 2017-03-02 VITALS — BP 110/70 | HR 67 | Temp 97.9°F | Resp 16 | Ht 66.0 in | Wt 223.2 lb

## 2017-03-02 DIAGNOSIS — E6609 Other obesity due to excess calories: Secondary | ICD-10-CM

## 2017-03-02 DIAGNOSIS — J4520 Mild intermittent asthma, uncomplicated: Secondary | ICD-10-CM

## 2017-03-02 DIAGNOSIS — G43909 Migraine, unspecified, not intractable, without status migrainosus: Secondary | ICD-10-CM | POA: Diagnosis not present

## 2017-03-02 DIAGNOSIS — Z23 Encounter for immunization: Secondary | ICD-10-CM | POA: Diagnosis not present

## 2017-03-02 DIAGNOSIS — Z6838 Body mass index (BMI) 38.0-38.9, adult: Secondary | ICD-10-CM

## 2017-03-02 NOTE — Patient Instructions (Signed)
You are doing well Congratulations on your weight loss See me in march for labs and a PE

## 2017-03-02 NOTE — Progress Notes (Signed)
Chief Complaint  Patient presents with  . Follow-up  Patient is here for follow-up She is on a keto diet.  She is lost 16 pounds.  She is exercising with a videotape.  She feels healthier.  She feels well rested. She uses her albuterol once a day prophylactically when she is going to exercise.  Otherwise has not had any shortness of breath or wheezing She is using soluble fiber on a daily basis.  This along with fiber in her diet has regulated her bowels.  She has not needed any of her other bowel medications.  She has also reduced her omeprazole to once a week as needed.  She is happy with the improvement of her bowels and digestion. She agrees to a flu shot today. She sees a neurologist for her migraines.  She is not needing any migraine medication at this time.  She states that on the advice of her neurologist she is taking a magnesium supplement.  This made her headaches go away  Patient Active Problem List   Diagnosis Date Noted  . Chronic insomnia 08/30/2016  . Asthma in adult, mild intermittent, uncomplicated 35/70/1779  . Class 2 obesity due to excess calories without serious comorbidity with body mass index (BMI) of 38.0 to 38.9 in adult 07/27/2016  . GERD without esophagitis 07/27/2016  . IBS (irritable bowel syndrome) 07/26/2016  . Migraine headache 07/26/2016    Outpatient Encounter Prescriptions as of 03/02/2017  Medication Sig  . albuterol (PROVENTIL HFA;VENTOLIN HFA) 108 (90 Base) MCG/ACT inhaler Inhale 1 puff into the lungs every 6 (six) hours as needed for wheezing or shortness of breath.  Marland Kitchen omeprazole (PRILOSEC) 40 MG capsule Take 40 mg by mouth as needed.   . SUMAtriptan (IMITREX) 100 MG tablet Take 100 mg by mouth as needed.     No facility-administered encounter medications on file as of 03/02/2017.     Allergies  Allergen Reactions  . Topamax [Topiramate]     Gave increase headache    Review of Systems  Constitutional: Negative for activity change,  appetite change and unexpected weight change.  HENT: Negative for congestion, dental problem, postnasal drip and rhinorrhea.   Eyes: Negative for redness and visual disturbance.  Respiratory: Negative for cough and shortness of breath.   Cardiovascular: Negative for chest pain, palpitations and leg swelling.  Gastrointestinal: Negative for abdominal pain, constipation and diarrhea.  Genitourinary: Negative for difficulty urinating, frequency and menstrual problem.  Musculoskeletal: Negative for arthralgias and back pain.  Neurological: Negative for dizziness and headaches.  Psychiatric/Behavioral: Positive for sleep disturbance. Negative for dysphoric mood. The patient is not nervous/anxious.        Feels overall well rested.  Sometimes does not sleep at night.  Not bothered by early awakening.    BP 110/70 (BP Location: Left Arm, Patient Position: Sitting, Cuff Size: Normal)   Pulse 67   Temp 97.9 F (36.6 C) (Other (Comment))   Resp 16   Ht 5' 6"  (1.676 m)   Wt 223 lb 4 oz (101.3 kg)   LMP 02/16/2017 (Approximate)   SpO2 96%   BMI 36.03 kg/m   Physical Exam  Constitutional: She is oriented to person, place, and time. She appears well-developed and well-nourished.  HENT:  Head: Normocephalic and atraumatic.  Mouth/Throat: Oropharynx is clear and moist.  Eyes: Pupils are equal, round, and reactive to light. Conjunctivae are normal.  Neck: Normal range of motion. Neck supple. No thyromegaly present.  Cardiovascular: Normal rate, regular rhythm  and normal heart sounds.   Pulmonary/Chest: Effort normal and breath sounds normal. No respiratory distress.  Musculoskeletal: Normal range of motion. She exhibits no edema.  Lymphadenopathy:    She has no cervical adenopathy.  Neurological: She is alert and oriented to person, place, and time.  Gait normal  Skin: Skin is warm and dry.  Psychiatric: She has a normal mood and affect. Her behavior is normal. Thought content normal.    Cheerful, normal mood and affect  Nursing note and vitals reviewed.   ASSESSMENT/PLAN:  1. Migraine without status migrainosus, not intractable, unspecified migraine type Controlled  2. Class 2 obesity due to excess calories without serious comorbidity with body mass index (BMI) of 38.0 to 38.9 in adult Improving  3. Asthma in adult, mild intermittent, uncomplicated Intermittent with exercise otherwise quiet  4. Need for immunization against influenza done - Flu Vaccine QUAD 36+ mos IM   Patient Instructions  You are doing well Congratulations on your weight loss See me in march for labs and a PE    Raylene Everts, MD

## 2017-04-13 ENCOUNTER — Other Ambulatory Visit: Payer: Self-pay

## 2017-04-13 ENCOUNTER — Encounter: Payer: Self-pay | Admitting: Family Medicine

## 2017-04-13 ENCOUNTER — Ambulatory Visit (INDEPENDENT_AMBULATORY_CARE_PROVIDER_SITE_OTHER): Payer: 59 | Admitting: Family Medicine

## 2017-04-13 ENCOUNTER — Telehealth: Payer: Self-pay | Admitting: Family Medicine

## 2017-04-13 VITALS — BP 132/80 | HR 76 | Temp 97.9°F | Resp 18 | Ht 66.0 in | Wt 224.0 lb

## 2017-04-13 DIAGNOSIS — J208 Acute bronchitis due to other specified organisms: Secondary | ICD-10-CM | POA: Diagnosis not present

## 2017-04-13 MED ORDER — ALBUTEROL SULFATE HFA 108 (90 BASE) MCG/ACT IN AERS: 1.0000 | INHALATION_SPRAY | Freq: Four times a day (QID) | RESPIRATORY_TRACT | 0 refills | Status: DC | PRN

## 2017-04-13 MED ORDER — BENZONATATE 200 MG PO CAPS: 200.0000 mg | ORAL_CAPSULE | Freq: Two times a day (BID) | ORAL | 0 refills | Status: DC | PRN

## 2017-04-13 NOTE — Patient Instructions (Signed)
Push fluids Take the mucinex DM as directed In addition may use tessalon as needed for cough Use a humidifier if you have one Call if worse, or if not improved in 10 days

## 2017-04-13 NOTE — Progress Notes (Signed)
Chief Complaint  Patient presents with  . Cough  . Sore Throat   Patient is here for an acute visit. She woke up this morning with a cough and sore throats and made an appointment to come in.  She states she is "afraid will go into bronchitis." I explained to her that most coughs are bronchitis, and most bronchitis is a virus.  We discussed symptomatic care of viral infections. She has a sore throat and cough.  No sputum.  No headache.  No ear pain.  No runny nose.  No stuffy nose.  No fever or chills.  Mild tiredness, no real malaise or body aches.  No nausea or vomiting.  No wheezing or shortness of breath.  She states that her throat hurts when she coughs.  No known exposure to strep or pneumonia.  Patient Active Problem List   Diagnosis Date Noted  . Chronic insomnia 08/30/2016  . Asthma in adult, mild intermittent, uncomplicated 79/39/0300  . Class 2 obesity due to excess calories without serious comorbidity with body mass index (BMI) of 38.0 to 38.9 in adult 07/27/2016  . GERD without esophagitis 07/27/2016  . IBS (irritable bowel syndrome) 07/26/2016  . Migraine headache 07/26/2016    Outpatient Encounter Medications as of 04/13/2017  Medication Sig  . albuterol (PROVENTIL HFA;VENTOLIN HFA) 108 (90 Base) MCG/ACT inhaler Inhale 1 puff into the lungs every 6 (six) hours as needed for wheezing or shortness of breath.  Marland Kitchen omeprazole (PRILOSEC) 40 MG capsule Take 40 mg by mouth as needed.   . [DISCONTINUED] albuterol (PROVENTIL HFA;VENTOLIN HFA) 108 (90 Base) MCG/ACT inhaler Inhale 1 puff into the lungs every 6 (six) hours as needed for wheezing or shortness of breath.  . benzonatate (TESSALON) 200 MG capsule Take 1 capsule (200 mg total) by mouth 2 (two) times daily as needed for cough.  . [DISCONTINUED] SUMAtriptan (IMITREX) 100 MG tablet Take 100 mg by mouth as needed.    No facility-administered encounter medications on file as of 04/13/2017.     Allergies  Allergen  Reactions  . Topamax [Topiramate]     Gave increase headache    Review of Systems  Constitutional: Negative for activity change, appetite change, chills, fatigue and fever.  HENT: Positive for sore throat. Negative for congestion, postnasal drip, rhinorrhea, sinus pressure and sinus pain.   Eyes: Negative for redness and visual disturbance.  Respiratory: Positive for cough. Negative for shortness of breath and wheezing.   Cardiovascular: Negative for chest pain and leg swelling.  Gastrointestinal: Negative for diarrhea, nausea and vomiting.  Neurological: Negative for dizziness and headaches.  All other systems reviewed and are negative.   BP 132/80 (BP Location: Right Arm, Patient Position: Sitting, Cuff Size: Normal)   Pulse 76   Temp 97.9 F (36.6 C) (Temporal)   Resp 18   Ht 5' 6"  (1.676 m)   Wt 224 lb (101.6 kg)   SpO2 99%   BMI 36.15 kg/m   Physical Exam  Constitutional: She is oriented to person, place, and time. She appears well-developed and well-nourished.  HENT:  Head: Normocephalic and atraumatic.  Right Ear: Tympanic membrane, external ear and ear canal normal.  Left Ear: Tympanic membrane, external ear and ear canal normal.  Mouth/Throat: Mucous membranes are normal. Mucous membranes are not dry. Posterior oropharyngeal erythema present. No oropharyngeal exudate. Tonsils are 0 on the right. Tonsils are 0 on the left. No tonsillar exudate.  Eyes: Conjunctivae are normal. Pupils are equal, round, and reactive  to light.  Neck: Normal range of motion. Neck supple. No thyromegaly present.  Cardiovascular: Normal rate, regular rhythm and normal heart sounds.  Pulmonary/Chest: Effort normal and breath sounds normal. No respiratory distress.  Abdominal: Soft. Bowel sounds are normal.  Musculoskeletal: Normal range of motion. She exhibits no edema.  Lymphadenopathy:    She has no cervical adenopathy.  Neurological: She is alert and oriented to person, place, and time.    Skin: Skin is warm and dry.  Psychiatric: She has a normal mood and affect. Her behavior is normal. Thought content normal.  Nursing note and vitals reviewed.   ASSESSMENT/PLAN:  1. Acute viral bronchitis Discussed CDC recommendations that antibiotics are not indicated unless the infection last longer than 10 days, or if she develops fever over 102.  She is to call if she has worsening symptoms or questions   Patient Instructions  Push fluids Take the mucinex DM as directed In addition may use tessalon as needed for cough Use a humidifier if you have one Call if worse, or if not improved in 10 days    Raylene Everts, MD

## 2017-04-13 NOTE — Telephone Encounter (Signed)
Pt called wants to be seen today for a sick visit, woke up with a extremely painful cough and sore throat. Please call her   [phone]

## 2017-04-13 NOTE — Telephone Encounter (Signed)
She can be seen at 0930 this am.

## 2017-05-05 DIAGNOSIS — M9902 Segmental and somatic dysfunction of thoracic region: Secondary | ICD-10-CM | POA: Diagnosis not present

## 2017-05-05 DIAGNOSIS — S76312A Strain of muscle, fascia and tendon of the posterior muscle group at thigh level, left thigh, initial encounter: Secondary | ICD-10-CM | POA: Diagnosis not present

## 2017-05-05 DIAGNOSIS — M9903 Segmental and somatic dysfunction of lumbar region: Secondary | ICD-10-CM | POA: Diagnosis not present

## 2017-05-19 DIAGNOSIS — M9902 Segmental and somatic dysfunction of thoracic region: Secondary | ICD-10-CM | POA: Diagnosis not present

## 2017-05-19 DIAGNOSIS — M9903 Segmental and somatic dysfunction of lumbar region: Secondary | ICD-10-CM | POA: Diagnosis not present

## 2017-05-19 DIAGNOSIS — S76312A Strain of muscle, fascia and tendon of the posterior muscle group at thigh level, left thigh, initial encounter: Secondary | ICD-10-CM | POA: Diagnosis not present

## 2017-06-23 DIAGNOSIS — M9902 Segmental and somatic dysfunction of thoracic region: Secondary | ICD-10-CM | POA: Diagnosis not present

## 2017-06-23 DIAGNOSIS — M9903 Segmental and somatic dysfunction of lumbar region: Secondary | ICD-10-CM | POA: Diagnosis not present

## 2017-06-23 DIAGNOSIS — S76312A Strain of muscle, fascia and tendon of the posterior muscle group at thigh level, left thigh, initial encounter: Secondary | ICD-10-CM | POA: Diagnosis not present

## 2017-07-03 ENCOUNTER — Other Ambulatory Visit: Payer: Self-pay | Admitting: Family Medicine

## 2017-07-04 NOTE — Telephone Encounter (Signed)
Seen 12 12 18

## 2017-07-05 DIAGNOSIS — M9903 Segmental and somatic dysfunction of lumbar region: Secondary | ICD-10-CM | POA: Diagnosis not present

## 2017-07-05 DIAGNOSIS — S76312A Strain of muscle, fascia and tendon of the posterior muscle group at thigh level, left thigh, initial encounter: Secondary | ICD-10-CM | POA: Diagnosis not present

## 2017-07-05 DIAGNOSIS — M9902 Segmental and somatic dysfunction of thoracic region: Secondary | ICD-10-CM | POA: Diagnosis not present

## 2017-07-19 DIAGNOSIS — S76312A Strain of muscle, fascia and tendon of the posterior muscle group at thigh level, left thigh, initial encounter: Secondary | ICD-10-CM | POA: Diagnosis not present

## 2017-07-19 DIAGNOSIS — M9903 Segmental and somatic dysfunction of lumbar region: Secondary | ICD-10-CM | POA: Diagnosis not present

## 2017-07-19 DIAGNOSIS — M9902 Segmental and somatic dysfunction of thoracic region: Secondary | ICD-10-CM | POA: Diagnosis not present

## 2017-07-26 ENCOUNTER — Ambulatory Visit (INDEPENDENT_AMBULATORY_CARE_PROVIDER_SITE_OTHER): Payer: 59 | Admitting: Family Medicine

## 2017-07-26 ENCOUNTER — Encounter: Payer: Self-pay | Admitting: Family Medicine

## 2017-07-26 ENCOUNTER — Other Ambulatory Visit: Payer: Self-pay

## 2017-07-26 ENCOUNTER — Telehealth: Payer: Self-pay | Admitting: Family Medicine

## 2017-07-26 VITALS — BP 106/64 | HR 72 | Temp 98.4°F | Resp 12 | Ht 66.0 in | Wt 211.0 lb

## 2017-07-26 DIAGNOSIS — Z1231 Encounter for screening mammogram for malignant neoplasm of breast: Secondary | ICD-10-CM

## 2017-07-26 DIAGNOSIS — Z1239 Encounter for other screening for malignant neoplasm of breast: Secondary | ICD-10-CM

## 2017-07-26 DIAGNOSIS — Z23 Encounter for immunization: Secondary | ICD-10-CM | POA: Diagnosis not present

## 2017-07-26 DIAGNOSIS — Z0000 Encounter for general adult medical examination without abnormal findings: Secondary | ICD-10-CM | POA: Diagnosis not present

## 2017-07-26 MED ORDER — ALBUTEROL SULFATE HFA 108 (90 BASE) MCG/ACT IN AERS: INHALATION_SPRAY | RESPIRATORY_TRACT | 0 refills | Status: DC

## 2017-07-26 NOTE — Patient Instructions (Signed)
Congratulations on the weight loss and effort to improve your health Need to be seen yearly Flu shot in the fall  See Dr Mannie Stabile in follow up

## 2017-07-26 NOTE — Telephone Encounter (Signed)
Not done with chart...will do

## 2017-07-26 NOTE — Telephone Encounter (Signed)
Patient states she needs a mammogram scheduled, no mammogram order in chart.

## 2017-07-26 NOTE — Progress Notes (Signed)
Chief Complaint  Patient presents with  . Annual Exam   Annual exam Is trying to lose weight with healthy eating and exercise Up to date with health screening Regular dental care Infrequent wheezing from asthma GERD well controlled with omeprazole    Patient Active Problem List   Diagnosis Date Noted  . Chronic insomnia 08/30/2016  . Asthma in adult, mild intermittent, uncomplicated 56/25/6389  . Class 2 obesity due to excess calories without serious comorbidity with body mass index (BMI) of 38.0 to 38.9 in adult 07/27/2016  . GERD without esophagitis 07/27/2016  . IBS (irritable bowel syndrome) 07/26/2016  . Migraine headache 07/26/2016    Outpatient Encounter Medications as of 07/26/2017  Medication Sig  . albuterol (PROVENTIL HFA;VENTOLIN HFA) 108 (90 Base) MCG/ACT inhaler INHALE 1 PUFF INTO THE LUNGS EVERY 6 HOURS AS NEEDED FOR WHEEZING OR SHORTNESS OF BREATH  . omeprazole (PRILOSEC) 40 MG capsule Take 40 mg by mouth as needed.    No facility-administered encounter medications on file as of 07/26/2017.     Allergies  Allergen Reactions  . Topamax [Topiramate]     Gave increase headache    Review of Systems  Constitutional: Negative for activity change, appetite change and unexpected weight change.  HENT: Negative for congestion, dental problem, postnasal drip and rhinorrhea.   Eyes: Negative for redness and visual disturbance.  Respiratory: Negative for cough and shortness of breath.   Cardiovascular: Negative for chest pain, palpitations and leg swelling.  Gastrointestinal: Negative for abdominal pain, constipation and diarrhea.  Genitourinary: Negative for difficulty urinating, frequency and menstrual problem.  Musculoskeletal: Negative for arthralgias and back pain.  Neurological: Negative for dizziness and headaches.  Psychiatric/Behavioral: Negative for dysphoric mood and sleep disturbance. The patient is not nervous/anxious.     BP 106/64   Pulse 72    Temp 98.4 F (36.9 C)   Resp 12   Ht 5' 6"  (1.676 m)   Wt 211 lb (95.7 kg)   LMP 07/23/2017   SpO2 97%   BMI 34.06 kg/m   Physical Exam BP 106/64   Pulse 72   Temp 98.4 F (36.9 C)   Resp 12   Ht 5' 6"  (1.676 m)   Wt 211 lb (95.7 kg)   LMP 07/23/2017   SpO2 97%   BMI 34.06 kg/m   General Appearance:    Alert, cooperative, no distress, appears stated age  Head:    Normocephalic, without obvious abnormality, atraumatic  Eyes:    PERRL, conjunctiva/corneas clear, EOM's intact, fundi    benign, both eyes  Ears:    Normal TM's and external ear canals, both ears  Nose:   Nares normal, septum midline, mucosa normal, no drainage    or sinus tenderness  Throat:   Lips, mucosa, and tongue normal; teeth and gums normal  Neck:   Supple, symmetrical, trachea midline, no adenopathy;    thyroid:  no enlargement/tenderness/nodules; no carotid   bruit   Back:     Symmetric, no curvature, ROM normal, no CVA tenderness  Lungs:     Clear to auscultation bilaterally, respirations unlabored  Chest Wall:    No tenderness or deformity   Heart:    Regular rate and rhythm, S1 and S2 normal, no murmur, rub   or gallop  Breast Exam:    No tenderness, masses, or nipple abnormality  Abdomen:     Soft, non-tender, bowel sounds active all four quadrants,    no masses, no organomegaly  Extremities:  Extremities normal, atraumatic, no cyanosis or edema  Pulses:   2+ and symmetric all extremities  Skin:   Skin color, texture, turgor normal, no rashes or lesions  Lymph nodes:   Cervical, supraclavicular, and axillary nodes normal  Neurologic:   CNII-XII intact, normal strength, sensation and reflexes    throughout    ASSESSMENT/PLAN:  1. Annual PE 2. Screening for breast cancer  - MM Digital Screening; Future   Patient Instructions  Congratulations on the weight loss and effort to improve your health Need to be seen yearly Flu shot in the fall  See Dr Mannie Stabile in follow up   Raylene Everts, MD

## 2017-08-02 DIAGNOSIS — M9902 Segmental and somatic dysfunction of thoracic region: Secondary | ICD-10-CM | POA: Diagnosis not present

## 2017-08-02 DIAGNOSIS — M9903 Segmental and somatic dysfunction of lumbar region: Secondary | ICD-10-CM | POA: Diagnosis not present

## 2017-08-02 DIAGNOSIS — S76312A Strain of muscle, fascia and tendon of the posterior muscle group at thigh level, left thigh, initial encounter: Secondary | ICD-10-CM | POA: Diagnosis not present

## 2017-08-16 DIAGNOSIS — M9902 Segmental and somatic dysfunction of thoracic region: Secondary | ICD-10-CM | POA: Diagnosis not present

## 2017-08-16 DIAGNOSIS — S76312A Strain of muscle, fascia and tendon of the posterior muscle group at thigh level, left thigh, initial encounter: Secondary | ICD-10-CM | POA: Diagnosis not present

## 2017-08-16 DIAGNOSIS — M9903 Segmental and somatic dysfunction of lumbar region: Secondary | ICD-10-CM | POA: Diagnosis not present

## 2017-09-01 DIAGNOSIS — M9902 Segmental and somatic dysfunction of thoracic region: Secondary | ICD-10-CM | POA: Diagnosis not present

## 2017-09-01 DIAGNOSIS — S76312A Strain of muscle, fascia and tendon of the posterior muscle group at thigh level, left thigh, initial encounter: Secondary | ICD-10-CM | POA: Diagnosis not present

## 2017-09-01 DIAGNOSIS — M9903 Segmental and somatic dysfunction of lumbar region: Secondary | ICD-10-CM | POA: Diagnosis not present

## 2017-09-20 DIAGNOSIS — M9903 Segmental and somatic dysfunction of lumbar region: Secondary | ICD-10-CM | POA: Diagnosis not present

## 2017-09-20 DIAGNOSIS — M9901 Segmental and somatic dysfunction of cervical region: Secondary | ICD-10-CM | POA: Diagnosis not present

## 2017-09-20 DIAGNOSIS — M9902 Segmental and somatic dysfunction of thoracic region: Secondary | ICD-10-CM | POA: Diagnosis not present

## 2017-10-04 DIAGNOSIS — M9902 Segmental and somatic dysfunction of thoracic region: Secondary | ICD-10-CM | POA: Diagnosis not present

## 2017-10-04 DIAGNOSIS — M9903 Segmental and somatic dysfunction of lumbar region: Secondary | ICD-10-CM | POA: Diagnosis not present

## 2017-10-04 DIAGNOSIS — M9901 Segmental and somatic dysfunction of cervical region: Secondary | ICD-10-CM | POA: Diagnosis not present

## 2017-10-07 ENCOUNTER — Encounter: Payer: Self-pay | Admitting: Family Medicine

## 2017-10-18 DIAGNOSIS — M9901 Segmental and somatic dysfunction of cervical region: Secondary | ICD-10-CM | POA: Diagnosis not present

## 2017-10-18 DIAGNOSIS — M9903 Segmental and somatic dysfunction of lumbar region: Secondary | ICD-10-CM | POA: Diagnosis not present

## 2017-10-18 DIAGNOSIS — M9902 Segmental and somatic dysfunction of thoracic region: Secondary | ICD-10-CM | POA: Diagnosis not present

## 2017-11-10 DIAGNOSIS — M9901 Segmental and somatic dysfunction of cervical region: Secondary | ICD-10-CM | POA: Diagnosis not present

## 2017-11-10 DIAGNOSIS — M722 Plantar fascial fibromatosis: Secondary | ICD-10-CM | POA: Diagnosis not present

## 2017-11-10 DIAGNOSIS — M9903 Segmental and somatic dysfunction of lumbar region: Secondary | ICD-10-CM | POA: Diagnosis not present

## 2017-11-24 DIAGNOSIS — M722 Plantar fascial fibromatosis: Secondary | ICD-10-CM | POA: Diagnosis not present

## 2017-11-24 DIAGNOSIS — M9903 Segmental and somatic dysfunction of lumbar region: Secondary | ICD-10-CM | POA: Diagnosis not present

## 2017-11-24 DIAGNOSIS — M9901 Segmental and somatic dysfunction of cervical region: Secondary | ICD-10-CM | POA: Diagnosis not present

## 2017-12-13 DIAGNOSIS — M9903 Segmental and somatic dysfunction of lumbar region: Secondary | ICD-10-CM | POA: Diagnosis not present

## 2017-12-13 DIAGNOSIS — M722 Plantar fascial fibromatosis: Secondary | ICD-10-CM | POA: Diagnosis not present

## 2017-12-13 DIAGNOSIS — M9901 Segmental and somatic dysfunction of cervical region: Secondary | ICD-10-CM | POA: Diagnosis not present

## 2017-12-21 ENCOUNTER — Encounter: Payer: Self-pay | Admitting: Family Medicine

## 2017-12-21 ENCOUNTER — Other Ambulatory Visit: Payer: Self-pay

## 2017-12-21 ENCOUNTER — Ambulatory Visit: Payer: 59 | Admitting: Family Medicine

## 2017-12-21 VITALS — BP 116/70 | HR 66 | Temp 97.8°F | Resp 12 | Ht 66.0 in | Wt 200.4 lb

## 2017-12-21 DIAGNOSIS — M2170 Unequal limb length (acquired), unspecified site: Secondary | ICD-10-CM | POA: Diagnosis not present

## 2017-12-21 DIAGNOSIS — E785 Hyperlipidemia, unspecified: Secondary | ICD-10-CM

## 2017-12-21 DIAGNOSIS — J4520 Mild intermittent asthma, uncomplicated: Secondary | ICD-10-CM | POA: Diagnosis not present

## 2017-12-21 DIAGNOSIS — M7918 Myalgia, other site: Secondary | ICD-10-CM

## 2017-12-21 DIAGNOSIS — Z23 Encounter for immunization: Secondary | ICD-10-CM | POA: Diagnosis not present

## 2017-12-21 NOTE — Progress Notes (Signed)
Patient ID: Holly Yang, female    DOB: 04-May-1975, 42 y.o.   MRN: [id number]  Chief Complaint  Patient presents with  . Establish Care    former Holly Yang pt    Allergies Topamax [topiramate]  Subjective:   Holly Yang is a 42 y.o. female who presents to [facility] today.  HPI Holly Yang is previously been seen by Dr. Lysle Yang.  She has never been seen by me but comes in today because she needs some paperwork completed for work.  She will be establishing care with Dr. Anastasio Yang.  However, she needs a cholesterol panel and blood sugar performed for her job.  She brings his paperwork in.  She did have a physical with Dr. Meda Yang in March 2019.  However, there is no blood work performed at that time.  Patient reports that she is still exercising and being very active.  Has lost ten more pounds and doing well.   However, she does report that she needs note for a standing desk at work.  She got a note from her chiropractic physician stating that it would be recommended for her to have a modification at work which would allow her to stand during the workday to do her computer work.  However, her job does not recognize the chiropractor as a physician that can make this recommendation.  Therefore she comes in to get this note today.  She reports that she has been seen by Chiropractor at [place] for quite some time.  She has had chronic low back pain since was a child due to a fall with subsequent injury which caused her to have a leg length discrepancy due to bone growth.  She reports that since that time she has had back pain.  She has also experienced knee pain.  She reports that she does not have any new symptoms with her back pain.  She reports that she gets muscle tightness throughout the day if she does not change position frequently.  She reports that sitting all day puts pressure on the muscles of her low back and so she feels better if she is able to  stand to do some of her computer work because it relieves the stress on her low back.  She denies any numbness, tingling, or weakness in her lower extremities.  She reports there is been no change in her back pain over the past several years.  She reports that the back pain does not wake her from sleep.  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  She would like to be able to stand up and do work from Teaching laboratory technician.  She reports that she has been running and exercising a good bit which is helped her to lose weight.  She has lost 10 pounds since her last visit to the office.  She reports that she does feel she needs to see podiatry or orthopedics due to her knee pain.  She does have a leg length discrepancy and is previously used heel lifts.  She reports that the heel lifts that she has are old and make it very difficult to exercise.  She would like to be seen and evaluated by podiatry and orthopedics.  She does not have any new injuries.  She denies any swelling or weakness in her extremities. She does have a history of high  cholesterol but it has not been checked in quite some time.  She also needs to get a blood sugar checked for work.  She has a history of mild intermittent asthma and only has to use her inhaler on an as-needed basis.  She reports that sometimes heat, certain allergens can cause her to cough and she will use her inhaler.  She would be interested in getting a pneumonia shot today.  She has never been intubated for her asthma.   Past Medical History:  Diagnosis Date  . Asthma in adult, mild intermittent, uncomplicated 08/19/3788  . GERD without esophagitis 07/27/2016  . Irritable bowel disease    diarrhea  . Migraines     Past Surgical History:  Procedure Laterality Date  . CESAREAN SECTION  2007, 2010   Holly Yang, [place], [place], [place], [place]  .  EXCISION OF SKIN TAG N/A 04/11/2013   Procedure: EXCISION OF EXTERNAL HEMORRHOIDAL SKIN TAGS;  Surgeon: Jamesetta So, MD;  Location: [facility];  Service: General;  Laterality: N/A;  . WISDOM TOOTH EXTRACTION  1996    Family History  Problem Relation Age of Onset  . Cancer Paternal Grandfather        skin,liver  . Stroke Paternal Grandmother   . Arthritis Maternal Grandmother   . Other Maternal Grandfather        hemochromatosis  . Other Father 78       Amyloidosis  . Congestive Heart Failure Mother   . Heart disease Mother   . Hypertension Mother      Social History   Socioeconomic History  . Marital status: Single    Spouse name: Not on file  . Number of children: 2  . Years of education: Bachelors  . Highest education level: Not on file  Occupational History  . 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  Social Needs  . Financial resource strain: Not on file  . Food insecurity:    Worry: Not on file    Inability: Not on file  . Transportation needs:    Medical: Not on file    Non-medical: Not on file  Tobacco Use  . Smoking status: Former Research scientist (life sciences)  . Smokeless tobacco: Never Used  . Tobacco comment: Quit 15 years ago  Substance and Sexual Activity  . Alcohol use: Yes    Comment: on social event  . Drug use: No    Comment: Quit 15 years ago  . Sexual activity: Not Currently  Lifestyle  . Physical activity:    Days per week: Not on file    Minutes per session: Not on file  . Stress: Not on file  Relationships  . Social connections:    Talks on phone: Not on file    Gets together: Not on file    Attends religious service: Not on file    Active member of club or organization: Not on file    Attends meetings of clubs or organizations: Not on file    Relationship status: Not on file  Other Topics Concern  . Not on file  Social History Narrative   Biology degree / chemistry from [employer]   Lives at home alone   Children live at home with [place] with  their fathers, home with her on the weekends.   Right-handed   3-4 cups caffeine per day.    Review of Systems  Constitutional: Negative for activity change, appetite change and fever.  Eyes: Negative for visual disturbance.  Respiratory: Negative for cough, chest tightness and shortness of breath.   Cardiovascular: Negative for chest pain, palpitations and leg swelling.  Gastrointestinal: Negative for abdominal pain, constipation, nausea and vomiting.  Genitourinary: Negative for dysuria, frequency and urgency.  Musculoskeletal: Negative for gait problem and myalgias.       No current back pain today.  Skin: Negative for rash.  Neurological: Negative for dizziness, tremors, syncope, weakness, light-headedness, numbness and headaches.  Hematological: Negative for adenopathy.  Psychiatric/Behavioral: Negative for dysphoric mood and sleep disturbance. The patient is not nervous/anxious.      Objective:   BP 116/70 (BP Location: Right Arm, Patient Position: Sitting, Cuff Size: Large)   Pulse 66   Temp 97.8 F (36.6 C) (Temporal)   Resp 12   Ht 5' 6"  (1.676 m)   Wt 200 lb 6.4 oz (90.9 kg)   SpO2 99% Comment: room air  BMI 32.35 kg/m   Physical Exam  Constitutional: She is oriented to person, place, and time. She appears well-developed and well-nourished. No distress.  HENT:  Head: Normocephalic and atraumatic.  Mouth/Throat: Oropharynx is clear and moist.  Eyes: Pupils are equal, round, and reactive to light. Conjunctivae are normal. No scleral icterus.  Neck: Normal range of motion. Neck supple. No thyromegaly present.  Cardiovascular: Normal rate, regular rhythm and normal heart sounds.  Pulmonary/Chest: Effort normal. No respiratory distress. She has no wheezes.  Musculoskeletal: Normal range of motion. She exhibits no edema.  Neurological: She is alert and oriented to person, place, and time. She displays normal reflexes. No cranial nerve deficit or sensory deficit.  Coordination normal.  Skin: Skin is warm and dry. Capillary refill takes less than 2 seconds. She is not diaphoretic.  Psychiatric: She has a normal mood and affect. Her behavior is normal. Judgment and thought content normal.  Nursing note and vitals reviewed.    Assessment and Plan  1. Asthma in adult, mild intermittent, uncomplicated Patient has her albuterol MDI.  She was counseled concerning worrisome signs and symptoms of asthma exacerbation and if those occur call, return to clinic or go to the emergency department. - Pneumococcal conjugate vaccine 13-valent  2. Leg length discrepancy Referral to podiatry and Ortho for evaluation.  Discussed with patient that uncontrolled leg length discrepancy can cause knee pain and other orthopedic issues.  Referral placed - Ambulatory referral to Orthopedic Surgery  3. Need for immunization against influenza - Flu Vaccine QUAD 36+ mos IM  4. Hyperlipidemia, unspecified hyperlipidemia type - Lipid panel - Basic metabolic panel  5.  Chronic muscular low back pain Continue with exercise modifications to strengthen back and decrease pain.  I do believe that patient should have modifications of being able to stand while doing her work from her computer.  I do believe that this would aid in strengthening her muscles and decreasing chronic muscle weakness and fatigue.  Continue with core strengthening and exercise.  She was counseled concerning worrisome signs and symptoms of low back pain and if those occur to contact medical help.  Patient will follow-up with Dr. Curley Spice in 3 months or sooner if needed to establish care.  Patient was congratulated on her weight loss, dietary changes, and exercise. No follow-ups on file. Caren Macadam, MD 12/21/2017

## 2017-12-21 NOTE — Patient Instructions (Addendum)
Schedule mammogram for patient today.  Schedule your pap smear.   Dr. Morrison Old

## 2017-12-22 ENCOUNTER — Encounter: Payer: Self-pay | Admitting: Family Medicine

## 2017-12-22 DIAGNOSIS — M9903 Segmental and somatic dysfunction of lumbar region: Secondary | ICD-10-CM | POA: Diagnosis not present

## 2017-12-22 DIAGNOSIS — M9901 Segmental and somatic dysfunction of cervical region: Secondary | ICD-10-CM | POA: Diagnosis not present

## 2017-12-22 DIAGNOSIS — M722 Plantar fascial fibromatosis: Secondary | ICD-10-CM | POA: Diagnosis not present

## 2017-12-22 LAB — BASIC METABOLIC PANEL
BUN: 17 mg/dL (ref 7–25)
CHLORIDE: 103 mmol/L (ref 98–110)
CO2: 28 mmol/L (ref 20–32)
CREATININE: 0.9 mg/dL (ref 0.50–1.10)
Calcium: 9.5 mg/dL (ref 8.6–10.2)
GLUCOSE: 97 mg/dL (ref 65–99)
Potassium: 5.1 mmol/L (ref 3.5–5.3)
Sodium: 140 mmol/L (ref 135–146)

## 2017-12-22 LAB — LIPID PANEL
CHOL/HDL RATIO: 2.8 (calc) (ref <5.0)
Cholesterol: 215 mg/dL — ABNORMAL HIGH (ref <200)
HDL: 76 mg/dL (ref >50)
LDL Cholesterol (Calc): 123 mg/dL (calc) — ABNORMAL HIGH
Non-HDL Cholesterol (Calc): 139 mg/dL (calc) — ABNORMAL HIGH (ref <130)
TRIGLYCERIDES: 72 mg/dL (ref <150)

## 2017-12-29 DIAGNOSIS — M9903 Segmental and somatic dysfunction of lumbar region: Secondary | ICD-10-CM | POA: Diagnosis not present

## 2017-12-29 DIAGNOSIS — L920 Granuloma annulare: Secondary | ICD-10-CM | POA: Diagnosis not present

## 2017-12-29 DIAGNOSIS — M9901 Segmental and somatic dysfunction of cervical region: Secondary | ICD-10-CM | POA: Diagnosis not present

## 2017-12-29 DIAGNOSIS — M722 Plantar fascial fibromatosis: Secondary | ICD-10-CM | POA: Diagnosis not present

## 2018-01-04 ENCOUNTER — Ambulatory Visit (HOSPITAL_COMMUNITY)
Admission: RE | Admit: 2018-01-04 | Discharge: 2018-01-04 | Disposition: A | Discharge status: Home / Self Care | Payer: 59 | Source: Ambulatory Visit | Attending: Family Medicine | Admitting: Family Medicine

## 2018-01-04 ENCOUNTER — Other Ambulatory Visit: Payer: Self-pay | Admitting: Family Medicine

## 2018-01-04 DIAGNOSIS — Z1231 Encounter for screening mammogram for malignant neoplasm of breast: Secondary | ICD-10-CM | POA: Insufficient documentation

## 2018-01-04 DIAGNOSIS — Z1239 Encounter for other screening for malignant neoplasm of breast: Secondary | ICD-10-CM

## 2018-01-12 ENCOUNTER — Ambulatory Visit (INDEPENDENT_AMBULATORY_CARE_PROVIDER_SITE_OTHER): Payer: Self-pay

## 2018-01-12 ENCOUNTER — Encounter (INDEPENDENT_AMBULATORY_CARE_PROVIDER_SITE_OTHER): Payer: Self-pay | Admitting: Orthopedic Surgery

## 2018-01-12 ENCOUNTER — Ambulatory Visit (INDEPENDENT_AMBULATORY_CARE_PROVIDER_SITE_OTHER): Payer: 59 | Admitting: Orthopedic Surgery

## 2018-01-12 VITALS — Ht 66.0 in | Wt 200.4 lb

## 2018-01-12 DIAGNOSIS — M25562 Pain in left knee: Secondary | ICD-10-CM | POA: Diagnosis not present

## 2018-01-12 DIAGNOSIS — M25561 Pain in right knee: Secondary | ICD-10-CM | POA: Diagnosis not present

## 2018-01-12 DIAGNOSIS — G8929 Other chronic pain: Secondary | ICD-10-CM | POA: Diagnosis not present

## 2018-01-17 ENCOUNTER — Encounter (INDEPENDENT_AMBULATORY_CARE_PROVIDER_SITE_OTHER): Payer: Self-pay | Admitting: Orthopedic Surgery

## 2018-01-17 NOTE — Progress Notes (Signed)
Office Visit Note   Patient: Holly Yang           Date of Birth: Oct 08, 1975           MRN: [id number] Visit Date: 01/12/2018              Requested by: Caren Macadam, [address] [place], [place] [address] PCP: Caren Macadam, MD  Chief Complaint  Patient presents with  . Left Knee - Pain  . Right Knee - Pain      HPI: Patient is a 42 year old woman who complains of knee injuries right knee and left knee both hurting complains of tightening with exercise stiffness feels like the muscles are constricted she states that she runs 3 miles cannot move the knee the next day.  Patient also states she has a leg length inequality.  Assessment & Plan: Visit Diagnoses:  1. Chronic pain of right knee   2. Chronic pain of left knee     Plan: Recommended core strengthening for the leg length inequality.  Recommended quad isometric straight leg raises as well as closed chain kinetic exercises for the chondromalacia in the patellofemoral joint.  Follow-Up Instructions: Return if symptoms worsen or fail to improve.   Ortho Exam  Patient is alert, oriented, no adenopathy, well-dressed, normal affect, normal respiratory effort. Examination patient has a normal gait there is no effusion in either knee.  Patient has crepitation in the patellofemoral joint with range of motion of both knees.  There is no redness in either knee collaterals and cruciates are stable in both knees.  Medial lateral joint lines are nontender to palpation bilaterally.  With the patient standing she has approximately 1/4 inch leg length inequality with the left leg shorter.  Imaging: No results found. No images are attached to the encounter.  Labs: Lab Results  Component Value Date   HGBA1C 5.5 07/24/2015   CRP 0.6 07/24/2015     Lab Results  Component Value Date   ALBUMIN 4.1 07/27/2016   ALBUMIN 4.1 07/24/2015    Body mass index is 32.35 kg/m.  Orders:  Orders Placed This Encounter    Procedures  . XR Knee 1-2 Views Left  . XR Knee 1-2 Views Right   No orders of the defined types were placed in this encounter.    Procedures: No procedures performed  Clinical Data: No additional findings.  ROS:  All other systems negative, except as noted in the HPI. Review of Systems  Objective: Vital Signs: Ht 5' 6"  (1.676 m)   Wt 200 lb 6.4 oz (90.9 kg)   LMP 01/04/2018   BMI 32.35 kg/m   Specialty Comments:  No specialty comments available.  PMFS History: Patient Active Problem List   Diagnosis Date Noted  . Chronic insomnia 08/30/2016  . Asthma in adult, mild intermittent, uncomplicated 95/18/8416  . Class 2 obesity due to excess calories without serious comorbidity with body mass index (BMI) of 38.0 to 38.9 in adult 07/27/2016  . GERD without esophagitis 07/27/2016  . IBS (irritable bowel syndrome) 07/26/2016  . Migraine headache 07/26/2016   Past Medical History:  Diagnosis Date  . Asthma in adult, mild intermittent, uncomplicated 10/07/3014  . GERD without esophagitis 07/27/2016  . Irritable bowel disease    diarrhea  . Migraines     Family History  Problem Relation Age of Onset  . Cancer Paternal Grandfather        skin,liver  . Stroke Paternal Grandmother   .  Arthritis Maternal Grandmother   . Other Maternal Grandfather        hemochromatosis  . Other Father 41       Amyloidosis  . Congestive Heart Failure Mother   . Heart disease Mother   . Hypertension Mother     Past Surgical History:  Procedure Laterality Date  . CESAREAN SECTION  2007, 2010   [facility]ec[facility] Rubin, [place], [place], [place], [place]  . EXCISION OF SKIN TAG N/A 04/11/2013   Procedure: EXCISION OF EXTERNAL HEMORRHOIDAL SKIN TAGS;  Surgeon: Jamesetta So, MD;  Location: [facility];  Service: General;  Laterality: N/A;  . [place] History   Occupational History  . 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  Tobacco Use   . Smoking status: Former Research scientist (life sciences)  . Smokeless tobacco: Never Used  . Tobacco comment: Quit 15 years ago  Substance and Sexual Activity  . Alcohol use: Yes    Comment: on social event  . Drug use: No    Comment: Quit 15 years ago  . Sexual activity: Not Currently

## 2018-01-26 DIAGNOSIS — M722 Plantar fascial fibromatosis: Secondary | ICD-10-CM | POA: Diagnosis not present

## 2018-01-26 DIAGNOSIS — M9901 Segmental and somatic dysfunction of cervical region: Secondary | ICD-10-CM | POA: Diagnosis not present

## 2018-01-26 DIAGNOSIS — M9903 Segmental and somatic dysfunction of lumbar region: Secondary | ICD-10-CM | POA: Diagnosis not present

## 2018-01-31 ENCOUNTER — Ambulatory Visit: Payer: 59 | Admitting: Adult Health

## 2018-02-09 DIAGNOSIS — M9903 Segmental and somatic dysfunction of lumbar region: Secondary | ICD-10-CM | POA: Diagnosis not present

## 2018-02-09 DIAGNOSIS — M9901 Segmental and somatic dysfunction of cervical region: Secondary | ICD-10-CM | POA: Diagnosis not present

## 2018-02-09 DIAGNOSIS — M722 Plantar fascial fibromatosis: Secondary | ICD-10-CM | POA: Diagnosis not present

## 2018-02-16 ENCOUNTER — Encounter: Payer: Self-pay | Admitting: Adult Health

## 2018-02-16 ENCOUNTER — Encounter

## 2018-02-16 ENCOUNTER — Ambulatory Visit (INDEPENDENT_AMBULATORY_CARE_PROVIDER_SITE_OTHER): Payer: 59 | Admitting: Adult Health

## 2018-02-16 ENCOUNTER — Other Ambulatory Visit (HOSPITAL_COMMUNITY)
Admission: RE | Admit: 2018-02-16 | Discharge: 2018-02-16 | Disposition: A | Discharge status: Home / Self Care | Payer: 59 | Source: Ambulatory Visit | Attending: Adult Health | Admitting: Adult Health

## 2018-02-16 VITALS — BP 113/73 | HR 93 | Ht 65.0 in | Wt 202.5 lb

## 2018-02-16 DIAGNOSIS — Z1212 Encounter for screening for malignant neoplasm of rectum: Secondary | ICD-10-CM | POA: Diagnosis not present

## 2018-02-16 DIAGNOSIS — Z1211 Encounter for screening for malignant neoplasm of colon: Secondary | ICD-10-CM | POA: Diagnosis not present

## 2018-02-16 DIAGNOSIS — Z01419 Encounter for gynecological examination (general) (routine) without abnormal findings: Secondary | ICD-10-CM | POA: Insufficient documentation

## 2018-02-16 LAB — HEMOCCULT GUIAC POC 1CARD (OFFICE): FECAL OCCULT BLD: NEGATIVE

## 2018-02-16 NOTE — Addendum Note (Signed)
Addended by: Linton Rump on: 02/16/2018 10:35 AM   Modules accepted: Orders

## 2018-02-16 NOTE — Progress Notes (Addendum)
  Subjective:     Patient ID: Holly Yang, female   DOB: 1976-04-05, 42 y.o.   MRN: [id number]  HPI Holly Yang is a 42 year old white female, single, G2P2 in for pap and pelvic.she works in [place] at [employer].  PCP was Dr Mannie Stabile, but she is looking fo [place] doctor.   Review of Systems Patient denies any headaches, hearing loss, fatigue, blurred vision, shortness of breath, chest pain, abdominal pain, problems with bowel movements, urination, or intercourse(not active). No joint pain or mood swings.Periods lighter and cycles may be shorter     Objective:   Physical Exam BP 113/73 (BP Location: Left Arm, Patient Position: Sitting, Cuff Size: Large)   Pulse 93   Ht 5' 5"  (1.651 m)   Wt 202 lb 8 oz (91.9 kg)   LMP 01/29/2018   BMI 33.70 kg/m   PHQ 2 score 0. Skin warm and dry.Pelvic: external genitalia is normal in appearance no lesions, vagina: pink with good moisture and rugae,urethra has no lesions or masses noted, cervix:smooth and bulbous, uterus: normal size, shape and contour, non tender, no masses felt, adnexa: no masses or tenderness noted. Bladder is non tender and no masses felt. On rectal exam has good tone, no masses felt and hemoccult was negative. Examination chaperoned by Levy Pupa LPN.     Assessment:     1. Encounter for gynecological examination with Papanicolaou smear of cervix   2. Screening for colorectal cancer       Plan:     Pap with HPV sent Physical in 1 year Pap in 3 if normal Labs was at PCP Colonoscopy advised at 11, given numbers for GI Mammogram yearly

## 2018-02-17 LAB — CYTOLOGY - PAP
Diagnosis: NEGATIVE
HPV: NOT DETECTED

## 2018-02-28 DIAGNOSIS — M722 Plantar fascial fibromatosis: Secondary | ICD-10-CM | POA: Diagnosis not present

## 2018-02-28 DIAGNOSIS — M9901 Segmental and somatic dysfunction of cervical region: Secondary | ICD-10-CM | POA: Diagnosis not present

## 2018-02-28 DIAGNOSIS — M9903 Segmental and somatic dysfunction of lumbar region: Secondary | ICD-10-CM | POA: Diagnosis not present

## 2019-01-05 IMAGING — MG DIGITAL SCREENING BILATERAL MAMMOGRAM WITH TOMO AND CAD
8 series · 8 of 24 positions shown · non-contrast
Comparison: Previous exam(s).

CLINICAL DATA: Screening.

EXAM:
DIGITAL SCREENING BILATERAL MAMMOGRAM WITH TOMO AND CAD

[L MLO synth-2D]
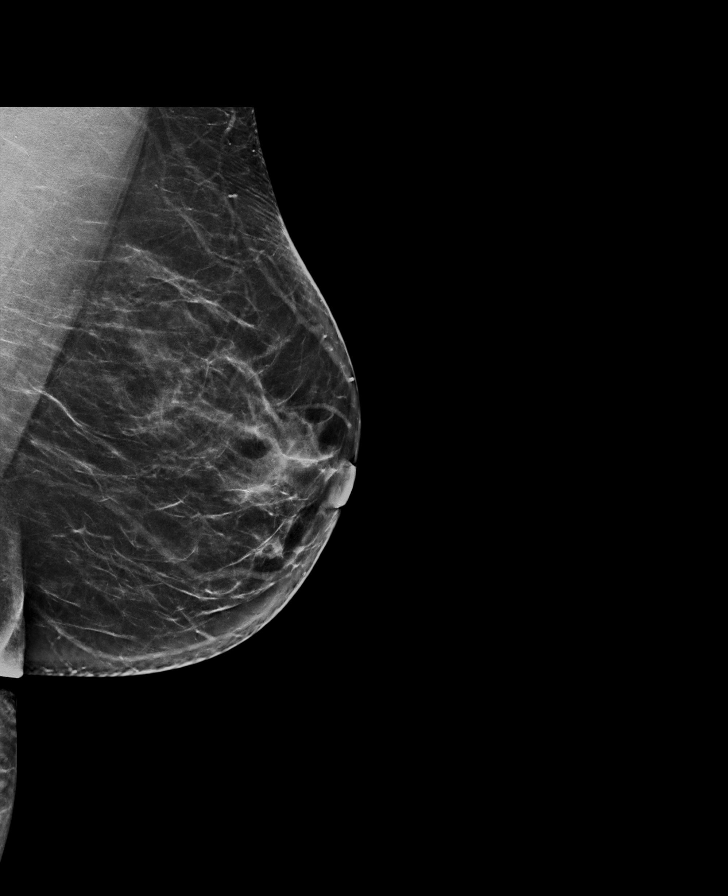

[L CC synth-2D]
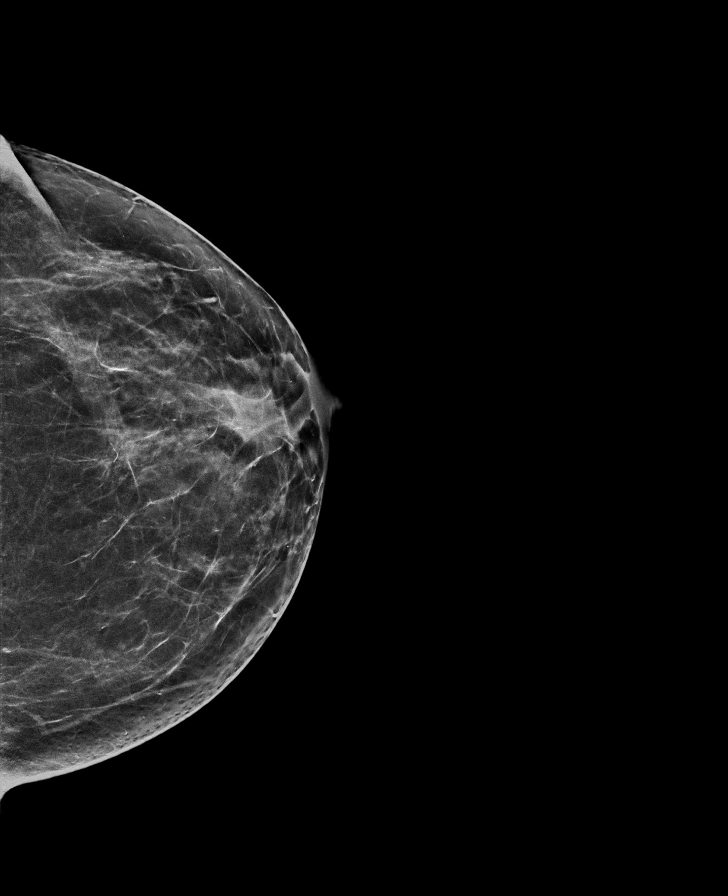

[R CC synth-2D]
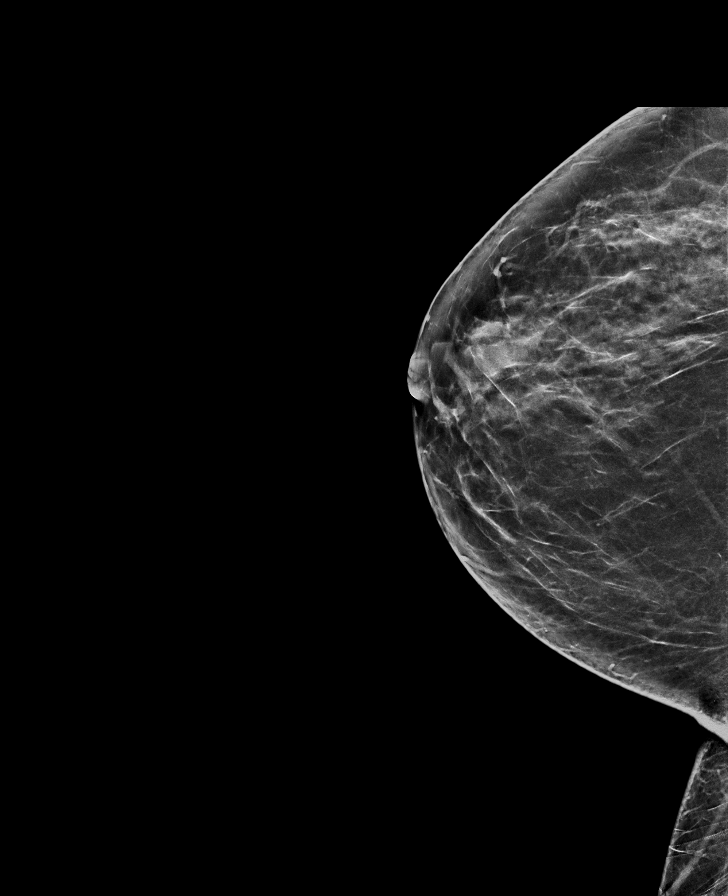

[R MLO synth-2D]
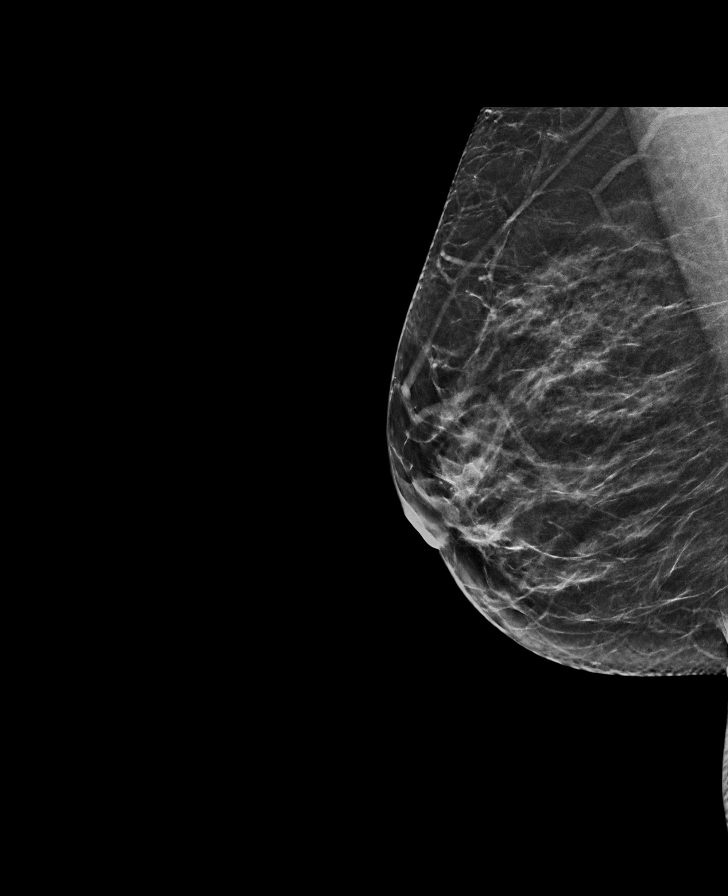

[R MLO tomo · tomo slice 33/66.0]
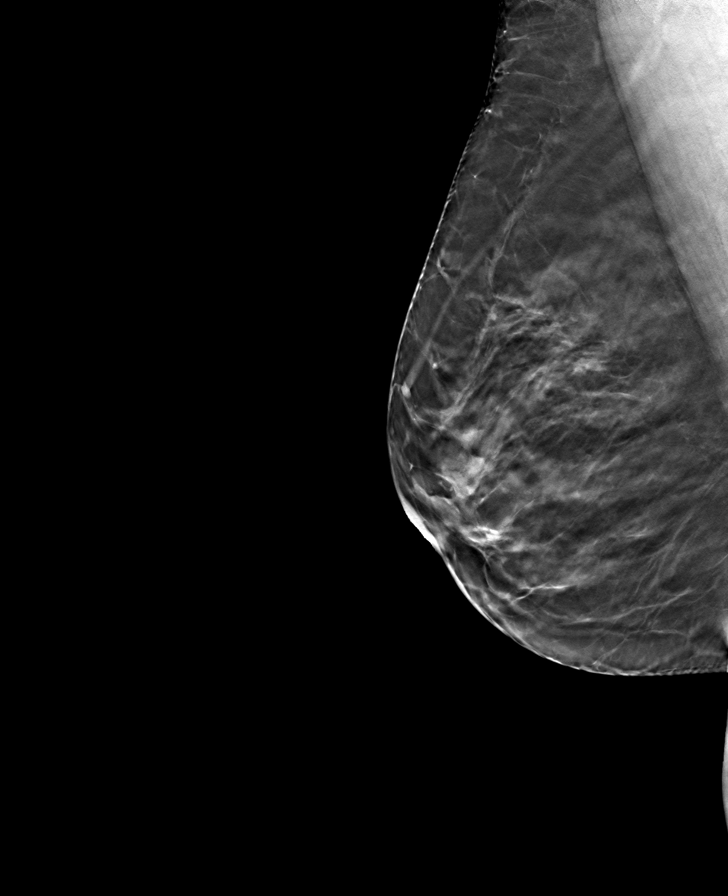

[R CC tomo · tomo slice 34/67.0]
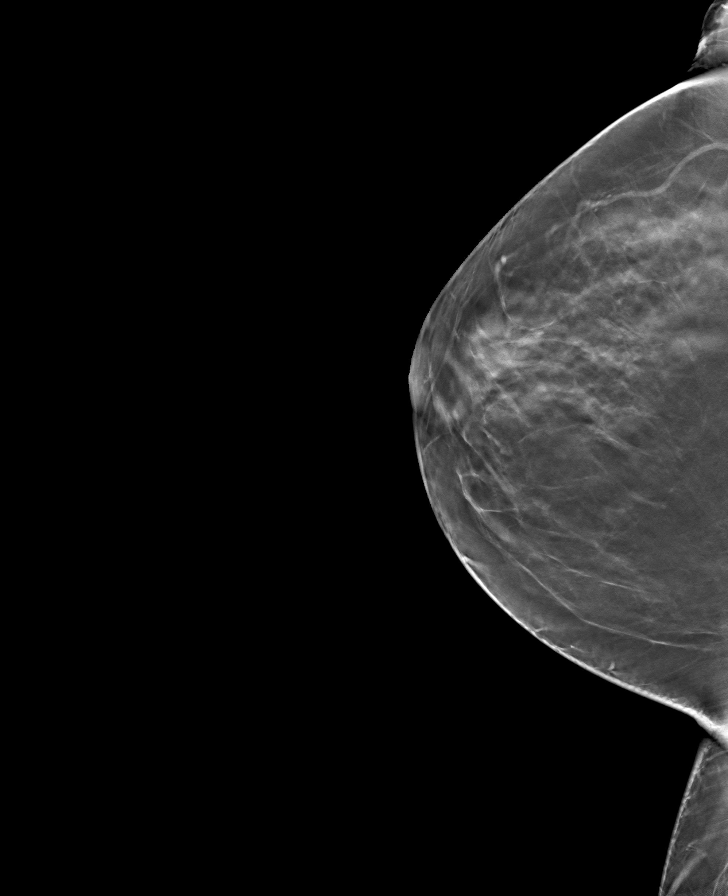

[L MLO tomo · tomo slice 37/73.0]
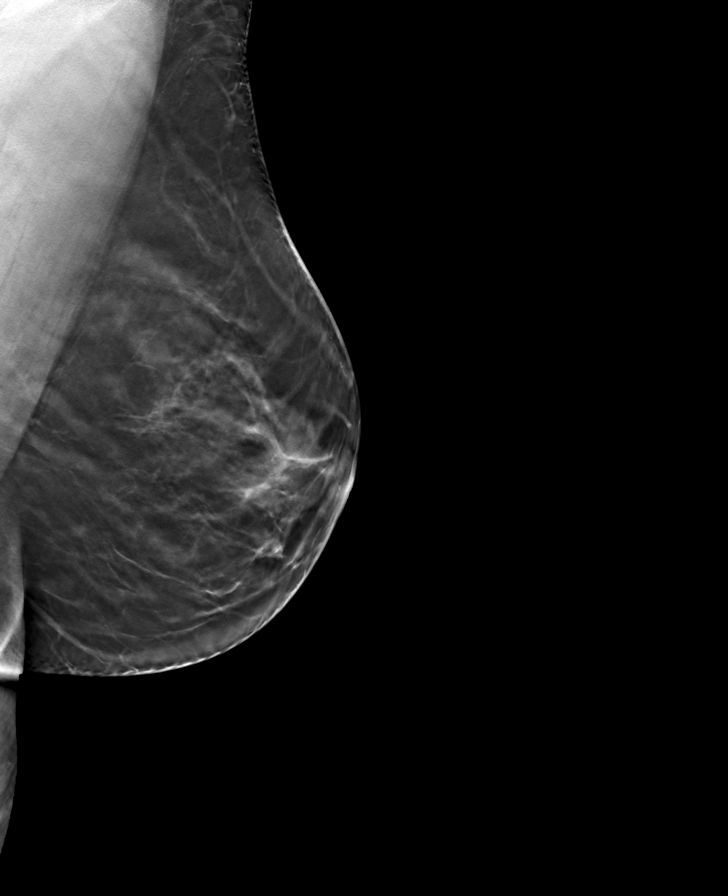

[L CC tomo · tomo slice 33/65.0]
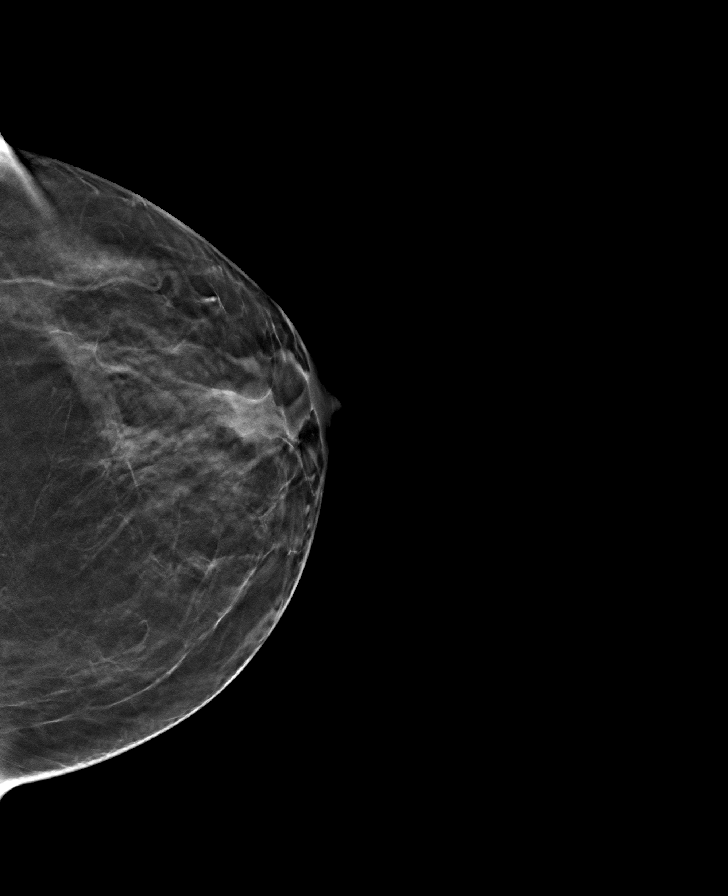

[8 of 24 positions shown; findings below may reference images not displayed]

ACR Breast Density Category c: The breast tissue is heterogeneously
dense, which may obscure small masses.
FINDINGS: There are no findings suspicious for malignancy. Images were
processed with CAD.
IMPRESSION: No mammographic evidence of malignancy. A result letter of this
screening mammogram will be mailed directly to the patient.

RECOMMENDATION:
Screening mammogram in one year. (Code:[id number]-U-LHB)

BI-RADS CATEGORY  1: Negative.

## 2021-10-19 ENCOUNTER — Encounter: Payer: Self-pay | Admitting: Family Medicine

## 2021-10-19 ENCOUNTER — Ambulatory Visit (INDEPENDENT_AMBULATORY_CARE_PROVIDER_SITE_OTHER): Payer: 59 | Admitting: Family Medicine

## 2021-10-19 VITALS — BP 132/80 | HR 69 | Ht 65.0 in | Wt 225.0 lb

## 2021-10-19 DIAGNOSIS — Z1231 Encounter for screening mammogram for malignant neoplasm of breast: Secondary | ICD-10-CM

## 2021-10-19 DIAGNOSIS — Z1211 Encounter for screening for malignant neoplasm of colon: Secondary | ICD-10-CM | POA: Diagnosis not present

## 2021-10-19 DIAGNOSIS — J4520 Mild intermittent asthma, uncomplicated: Secondary | ICD-10-CM | POA: Diagnosis not present

## 2021-10-19 DIAGNOSIS — G43009 Migraine without aura, not intractable, without status migrainosus: Secondary | ICD-10-CM | POA: Diagnosis not present

## 2021-10-19 DIAGNOSIS — R7301 Impaired fasting glucose: Secondary | ICD-10-CM

## 2021-10-19 DIAGNOSIS — E559 Vitamin D deficiency, unspecified: Secondary | ICD-10-CM

## 2021-10-19 DIAGNOSIS — E6609 Other obesity due to excess calories: Secondary | ICD-10-CM

## 2021-10-19 DIAGNOSIS — Z1159 Encounter for screening for other viral diseases: Secondary | ICD-10-CM

## 2021-10-19 DIAGNOSIS — Z6838 Body mass index (BMI) 38.0-38.9, adult: Secondary | ICD-10-CM

## 2021-10-19 MED ORDER — ALBUTEROL SULFATE HFA 108 (90 BASE) MCG/ACT IN AERS: INHALATION_SPRAY | RESPIRATORY_TRACT | 0 refills | Status: DC

## 2021-10-19 MED ORDER — SUMATRIPTAN SUCCINATE 25 MG PO TABS: 25.0000 mg | ORAL_TABLET | ORAL | 0 refills | Status: DC | PRN

## 2021-10-19 NOTE — Progress Notes (Unsigned)
New Patient Office Visit  Subjective:  Patient ID: Holly Yang, female    DOB: 07/26/1975  Age: 46 y.o. MRN: [id number]  CC:  Chief Complaint  Patient presents with   New Patient (Initial Visit)    Pt establishing care, pt c/o migraines on and off since 2016, has done natural methods to try to relieve this but has not been effective. Has Scheduled appt for Pap with OBGYN.     HPI Holly Yang is a 46 y.o. female with past medical history of migraines presents for establishing care. Migraines: she reports having migraines since 2016. Her last reported episode was last week. She followed up with neurology in 2018, who placed her on Topamax, and she reports not tolerating the medication well. She was taking sumatriptan which provided moderate relief. She reports not being on medication for a while.   Past Medical History:  Diagnosis Date   Asthma in adult, mild intermittent, uncomplicated 3/78/5885   GERD without esophagitis 07/27/2016   Irritable bowel disease    diarrhea   Migraines     Past Surgical History:  Procedure Laterality Date   CESAREAN SECTION  2007, 2010   Cecille Rubin, [place], [place], [place], [place]   EXCISION OF SKIN TAG N/A 04/11/2013   Procedure: EXCISION OF EXTERNAL HEMORRHOIDAL SKIN TAGS;  Surgeon: Jamesetta So, MD;  Location: [facility];  Service: General;  Laterality: N/A;   [place]    Family History  Problem Relation Age of Onset   Cancer Paternal Grandfather        skin,liver   Stroke Paternal Grandmother    Arthritis Maternal Grandmother    Other Maternal Grandfather        hemochromatosis   Other Father 4       Amyloidosis   Congestive Heart Failure Mother    Heart disease Mother    Hypertension Mother     Social History   Socioeconomic History   Marital status: Single    Spouse name: Not on file   Number of children: 2   Years of education: Bachelors   Highest education level: Not  on file  Occupational History   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  Tobacco Use   Smoking status: Former    Types: Cigarettes   Smokeless tobacco: Never   Tobacco comments:    Quit 15 years ago  Substance and Sexual Activity   Alcohol use: Not Currently    Comment: on social event   Drug use: No    Comment: Quit 15 years ago   Sexual activity: Not Currently  Other Topics Concern   Not on file  Social History Narrative   Biology degree / chemistry from [employer] at home alone   Children live at home with [place] with their fathers, home with her on the weekends.   Right-handed   3-4 cups caffeine per day.   Social Determinants of Health   Financial Resource Strain: Not on file  Food Insecurity: Not on file  Transportation Needs: Not on file  Physical Activity: Not on file  Stress: Not on file  Social Connections: Not on file  Intimate Partner Violence: Not on file    ROS Review of Systems  Constitutional:  Negative for chills, fatigue and fever.  HENT:  Negative for sinus pressure, sneezing and sore throat.   Eyes:  Negative for photophobia, pain and redness.  Respiratory:  Negative for  chest tightness.   Cardiovascular:  Negative for chest pain and palpitations.  Gastrointestinal:  Negative for diarrhea and nausea.  Endocrine: Negative for polyphagia and polyuria.  Genitourinary:  Negative for frequency, hematuria and vaginal discharge.  Musculoskeletal:  Negative for back pain and neck pain.  Skin:  Negative for rash and wound.  Neurological:  Negative for dizziness, weakness, light-headedness and headaches.  Psychiatric/Behavioral:  Negative for confusion, self-injury and suicidal ideas.     Objective:   Today's Vitals: BP 132/80   Pulse 69   Ht _0  (1.651 m)   Wt 225 lb (102.1 kg)   SpO2 98%   BMI 37.44 kg/m   Physical Exam HENT:     Head: Normocephalic.     Right Ear: External ear normal.     Left Ear: External ear normal.      Nose: No congestion or rhinorrhea.     Mouth/Throat:     Mouth: Mucous membranes are moist.  Eyes:     Extraocular Movements: Extraocular movements intact.     Pupils: Pupils are equal, round, and reactive to light.  Cardiovascular:     Rate and Rhythm: Normal rate and regular rhythm.     Pulses: Normal pulses.     Heart sounds: Normal heart sounds.  Pulmonary:     Effort: Pulmonary effort is normal.     Breath sounds: Normal breath sounds. No wheezing.  Abdominal:     Palpations: Abdomen is soft.     Tenderness: There is no right CVA tenderness or left CVA tenderness.  Musculoskeletal:     Cervical back: No rigidity.     Right lower leg: No edema.     Left lower leg: No edema.  Skin:    Findings: No lesion or rash.  Neurological:     Mental Status: She is alert and oriented to person, place, and time.  Psychiatric:     Comments: Normal affect     Assessment & Plan:   Problem List Items Addressed This Visit       Cardiovascular and Mediastinum   Migraine headache - Primary    Will start patient back on sumatriptan encouraged to take magnesium 600 mg daily for prophylactic therapy      Relevant Medications   SUMAtriptan (IMITREX) 25 MG tablet     Respiratory   Asthma in adult, mild intermittent, uncomplicated    Controlled Rescue inhaler ordered      Relevant Medications   albuterol (VENTOLIN HFA) 108 (90 Base) MCG/ACT inhaler     Other   Class 2 obesity due to excess calories without serious comorbidity with body mass index (BMI) of 38.0 to 38.9 in adult   Relevant Orders   CBC with Differential/Platelet (Completed)   CMP14+EGFR (Completed)   TSH + free T4 (Completed)   Lipid panel (Completed)   Other Visit Diagnoses     Colon cancer screening       Relevant Orders   Cologuard   Breast cancer screening by mammogram       Relevant Orders   MM 3D SCREEN BREAST BILATERAL   Vitamin D deficiency       Relevant Orders   Vitamin D (25 hydroxy)  (Completed)   IFG (impaired fasting glucose)       Relevant Orders   Hemoglobin A1C (Completed)   Need for hepatitis C screening test       Relevant Orders   Hepatitis C antibody (Completed)       Outpatient  Encounter Medications as of 10/19/2021  Medication Sig   [DISCONTINUED] albuterol (PROVENTIL HFA;VENTOLIN HFA) 108 (90 Base) MCG/ACT inhaler INHALE 1 PUFF INTO THE LUNGS EVERY 6 HOURS AS NEEDED FOR WHEEZING OR SHORTNESS OF BREATH   albuterol (VENTOLIN HFA) 108 (90 Base) MCG/ACT inhaler INHALE 1-2 PUFF INTO THE LUNGS EVERY 6 HOURS AS NEEDED FOR WHEEZING OR SHORTNESS OF BREATH   SUMAtriptan (IMITREX) 25 MG tablet Take 1 tablet (25 mg total) by mouth as needed for migraine. May repeat dose at 2-hr intervals; Max 200 mg PO daily   [DISCONTINUED] SUMAtriptan (IMITREX) 25 MG tablet Take by mouth. (Patient not taking: Reported on 10/19/2021)   No facility-administered encounter medications on file as of 10/19/2021.    Follow-up: Return in about 3 months (around 01/19/2022).   Alvira Monday, FNP

## 2021-10-19 NOTE — Patient Instructions (Signed)
I appreciate the opportunity to provide care to you today!    Follow up:  3 months  Labs: please stop by the lab today to get your blood drawn (CBC, CMP, TSH, Lipid profile, HgA1c, Vit D)  Please pick up your prescriptions at the pharmacy  - you can take magnesium 600 mg daily for prophylactic treatment of migraines     Please continue to a heart-healthy diet and increase your physical activities. Try to exercise for 47mns at least three times a week.      It was a pleasure to see you and I look forward to continuing to work together on your health and well-being. Please do not hesitate to call the office if you need care or have questions about your care.   Have a wonderful day and week. With Gratitude, GAlvira MondayMSN, FNP-BC

## 2021-10-20 LAB — CBC WITH DIFFERENTIAL/PLATELET
Basophils Absolute: 0 10*3/uL (ref 0.0–0.2)
Basos: 0 %
EOS (ABSOLUTE): 0.1 10*3/uL (ref 0.0–0.4)
Eos: 2 %
Hematocrit: 41.8 % (ref 34.0–46.6)
Hemoglobin: 13.9 g/dL (ref 11.1–15.9)
Immature Grans (Abs): 0 10*3/uL (ref 0.0–0.1)
Immature Granulocytes: 0 %
Lymphocytes Absolute: 2.5 10*3/uL (ref 0.7–3.1)
Lymphs: 37 %
MCH: 30.8 pg (ref 26.6–33.0)
MCHC: 33.3 g/dL (ref 31.5–35.7)
MCV: 93 fL (ref 79–97)
Monocytes Absolute: 0.4 10*3/uL (ref 0.1–0.9)
Monocytes: 6 %
Neutrophils Absolute: 3.7 10*3/uL (ref 1.4–7.0)
Neutrophils: 55 %
Platelets: 192 10*3/uL (ref 150–450)
RBC: 4.52 x10E6/uL (ref 3.77–5.28)
RDW: 12.8 % (ref 11.7–15.4)
WBC: 6.8 10*3/uL (ref 3.4–10.8)

## 2021-10-20 LAB — CMP14+EGFR
ALT: 18 IU/L (ref 0–32)
AST: 16 IU/L (ref 0–40)
Albumin/Globulin Ratio: 1.8 (ref 1.2–2.2)
Albumin: 4.4 g/dL (ref 3.8–4.8)
Alkaline Phosphatase: 71 IU/L (ref 44–121)
BUN/Creatinine Ratio: 16 (ref 9–23)
BUN: 13 mg/dL (ref 6–24)
Bilirubin Total: 0.4 mg/dL (ref 0.0–1.2)
CO2: 23 mmol/L (ref 20–29)
Calcium: 8.8 mg/dL (ref 8.7–10.2)
Chloride: 101 mmol/L (ref 96–106)
Creatinine, Ser: 0.82 mg/dL (ref 0.57–1.00)
Globulin, Total: 2.4 g/dL (ref 1.5–4.5)
Glucose: 96 mg/dL (ref 70–99)
Potassium: 4.1 mmol/L (ref 3.5–5.2)
Sodium: 138 mmol/L (ref 134–144)
Total Protein: 6.8 g/dL (ref 6.0–8.5)
eGFR: 89 mL/min/{1.73_m2} (ref >59)

## 2021-10-20 LAB — TSH+FREE T4
Free T4: 1.15 ng/dL (ref 0.82–1.77)
TSH: 3.49 u[IU]/mL (ref 0.450–4.500)

## 2021-10-20 LAB — LIPID PANEL
Chol/HDL Ratio: 3.4 ratio (ref 0.0–4.4)
Cholesterol, Total: 223 mg/dL — ABNORMAL HIGH (ref 100–199)
HDL: 66 mg/dL (ref >39)
LDL Chol Calc (NIH): 136 mg/dL — ABNORMAL HIGH (ref 0–99)
Triglycerides: 121 mg/dL (ref 0–149)
VLDL Cholesterol Cal: 21 mg/dL (ref 5–40)

## 2021-10-20 LAB — HEMOGLOBIN A1C
Est. average glucose Bld gHb Est-mCnc: 117 mg/dL
Hgb A1c MFr Bld: 5.7 % — ABNORMAL HIGH (ref 4.8–5.6)

## 2021-10-20 LAB — VITAMIN D 25 HYDROXY (VIT D DEFICIENCY, FRACTURES): Vit D, 25-Hydroxy: 33 ng/mL (ref 30.0–100.0)

## 2021-10-20 LAB — HEPATITIS C ANTIBODY: Hep C Virus Ab: NONREACTIVE

## 2021-10-20 NOTE — Assessment & Plan Note (Signed)
Controlled Rescue inhaler ordered

## 2021-10-20 NOTE — Assessment & Plan Note (Signed)
Will start patient back on sumatriptan encouraged to take magnesium 600 mg daily for prophylactic therapy

## 2021-10-22 NOTE — Progress Notes (Signed)
Please inform the patient that she has prediabetes and her cholesterol levels are slightly elevated.   I recommend low carbs, fat and calorie diet and increasing physical activities.

## 2021-10-30 ENCOUNTER — Ambulatory Visit (INDEPENDENT_AMBULATORY_CARE_PROVIDER_SITE_OTHER): Payer: 59 | Admitting: Adult Health

## 2021-10-30 ENCOUNTER — Encounter: Payer: Self-pay | Admitting: Adult Health

## 2021-10-30 ENCOUNTER — Other Ambulatory Visit (HOSPITAL_COMMUNITY)
Admission: RE | Admit: 2021-10-30 | Discharge: 2021-10-30 | Disposition: A | Discharge status: Home / Self Care | Payer: 59 | Source: Ambulatory Visit | Attending: Adult Health | Admitting: Adult Health

## 2021-10-30 VITALS — BP 107/67 | HR 73 | Ht 64.75 in | Wt 224.0 lb

## 2021-10-30 DIAGNOSIS — Z01419 Encounter for gynecological examination (general) (routine) without abnormal findings: Secondary | ICD-10-CM

## 2021-10-30 DIAGNOSIS — Z23 Encounter for immunization: Secondary | ICD-10-CM | POA: Insufficient documentation

## 2021-10-30 DIAGNOSIS — Z1211 Encounter for screening for malignant neoplasm of colon: Secondary | ICD-10-CM | POA: Insufficient documentation

## 2021-10-30 LAB — HEMOCCULT GUIAC POC 1CARD (OFFICE): Fecal Occult Blood, POC: NEGATIVE

## 2021-10-30 NOTE — Progress Notes (Signed)
Patient ID: Holly Yang, female   DOB: [birth year], 46 y.o.   MRN: [id number] History of Present Illness: Jniya is a 46 year old white female,single, G2P2 in for a well woman gyn exam and pap.  PCP is Verne Grain FNP.   Current Medications, Allergies, Past Medical History, Past Surgical History, Family History and Social History were reviewed in [employer] record.     Review of Systems:  Patient denies any daily headaches, hearing loss, fatigue, blurred vision, shortness of breath, chest pain, abdominal pain, problems with bowel movements, urination, or intercourse.(Not active). No joint pain or mood swings.  Not sleeping as well. Still has regular periods.   Physical Exam:BP 107/67 (BP Location: Left Arm, Patient Position: Sitting, Cuff Size: Large)   Pulse 73   Ht 5' 4.75" (1.645 m)   Wt 224 lb (101.6 kg)   LMP 10/25/2021 (Approximate)   BMI 37.56 kg/m   General:  Well developed, well nourished, no acute distress Skin:  Warm and dry Neck:  Midline trachea, normal thyroid, good ROM, no lymphadenopathy Lungs; Clear to auscultation bilaterally Breast:  No dominant palpable mass, retraction, or nipple discharge Cardiovascular: Regular rate and rhythm Abdomen:  Soft, non tender, no hepatosplenomegaly Pelvic:  External genitalia is normal in appearance, no lesions.  The vagina is normal in appearance. +period blood.Urethra has no lesions or masses. The cervix is smooth, pap with HR HPV genotyping performed.  Uterus is felt to be normal size, shape, and contour.  No adnexal masses or tenderness noted.Bladder is non tender, no masses felt. Rectal: Good sphincter tone, no polyps, or hemorrhoids felt.  Hemoccult negative. Extremities/musculoskeletal:  No swelling or varicosities noted, no clubbing or cyanosis Psych: No mood changes, alert and cooperative,seems happy AA is 0 Fall risk is low    10/30/2021   11:50 AM 10/19/2021    9:20 AM 02/16/2018    9:53 AM   Depression screen PHQ 2/9  Decreased Interest 0 0 0  Down, Depressed, Hopeless 0 0 0  PHQ - 2 Score 0 0 0  Altered sleeping 2    Tired, decreased energy 2    Change in appetite 2    Feeling bad or failure about yourself  0    Trouble concentrating 2    Moving slowly or fidgety/restless 0    Suicidal thoughts 0    PHQ-9 Score 8         10/30/2021   11:50 AM  GAD 7 : Generalized Anxiety Score  Nervous, Anxious, on Edge 1  Control/stop worrying 0  Worry too much - different things 0  Trouble relaxing 0  Restless 0  Easily annoyed or irritable 1  Afraid - awful might happen 0  Total GAD 7 Score 2      Upstream - 10/30/21 1155       Pregnancy Intention Screening   Does the patient want to become pregnant in the next year? No    Does the patient's partner want to become pregnant in the next year? No    Would the patient like to discuss contraceptive options today? No      Contraception Wrap Up   Current Method Abstinence    End Method Abstinence             Examination chaperoned by Levy Pupa LPN  Impression and Plan: 1. Encounter for gynecological examination with Papanicolaou smear of cervix Pap sent Pap in 3 years if normal Physical with PCP next year Labs  with PCP Number given for x[facility] at [facility] to scheduled mammogram  Try to eat out of garden ,more seeds and berried, chicken,turkey and fish. Mediterranean diet is good. Swim and walk more  2. Encounter for screening fecal occult blood testing Hemoccult is negative Has cologuard at home

## 2021-11-02 LAB — CYTOLOGY - PAP
Comment: NEGATIVE
Diagnosis: NEGATIVE
High risk HPV: NEGATIVE

## 2021-11-16 LAB — COLOGUARD: COLOGUARD: NEGATIVE

## 2021-11-24 NOTE — Progress Notes (Signed)
Please inform the patient that her cologurad is negative for colon cancer.

## 2022-01-19 ENCOUNTER — Encounter: Payer: Self-pay | Admitting: Family Medicine

## 2022-01-19 ENCOUNTER — Ambulatory Visit (INDEPENDENT_AMBULATORY_CARE_PROVIDER_SITE_OTHER): Payer: 59 | Admitting: Family Medicine

## 2022-01-19 VITALS — BP 104/69 | HR 74 | Ht 65.0 in | Wt 230.0 lb

## 2022-01-19 DIAGNOSIS — E559 Vitamin D deficiency, unspecified: Secondary | ICD-10-CM | POA: Diagnosis not present

## 2022-01-19 DIAGNOSIS — G43009 Migraine without aura, not intractable, without status migrainosus: Secondary | ICD-10-CM | POA: Diagnosis not present

## 2022-01-19 DIAGNOSIS — R7301 Impaired fasting glucose: Secondary | ICD-10-CM

## 2022-01-19 DIAGNOSIS — J4520 Mild intermittent asthma, uncomplicated: Secondary | ICD-10-CM

## 2022-01-19 DIAGNOSIS — R059 Cough, unspecified: Secondary | ICD-10-CM | POA: Insufficient documentation

## 2022-01-19 DIAGNOSIS — Z23 Encounter for immunization: Secondary | ICD-10-CM | POA: Diagnosis not present

## 2022-01-19 DIAGNOSIS — R058 Other specified cough: Secondary | ICD-10-CM

## 2022-01-19 DIAGNOSIS — K219 Gastro-esophageal reflux disease without esophagitis: Secondary | ICD-10-CM

## 2022-01-19 MED ORDER — SUMATRIPTAN SUCCINATE 50 MG PO TABS: 50.0000 mg | ORAL_TABLET | ORAL | 0 refills | Status: DC | PRN

## 2022-01-19 NOTE — Assessment & Plan Note (Signed)
Will increase sumatriptan to 50 mg PRN Encouraged to continue treatment for migraines

## 2022-01-19 NOTE — Assessment & Plan Note (Signed)
Patient educated on CDC recommendation for the vaccine. Verbal consent was obtained from the patient, vaccine administered by nurse, no sign of adverse reactions noted at this time. Patient education on arm soreness and use of tylenol or ibuprofen for this patient  was discussed. Patient educated on the signs and symptoms of adverse effect and advise to contact the office if they occur.  

## 2022-01-19 NOTE — Patient Instructions (Signed)
I appreciate the opportunity to provide care to you today!    Follow up:  4 months  Labs: please stop by the lab during the week to get your blood drawn (CBC, CMP, TSH, Lipid profile, HgA1c, Vit D)   Please stop by your local pharmacy and get your Tdap and Shingles vaccine     Please continue to a heart-healthy diet and increase your physical activities. Try to exercise for 72mns at least three times a week.      It was a pleasure to see you and I look forward to continuing to work together on your health and well-being. Please do not hesitate to call the office if you need care or have questions about your care.   Have a wonderful day and week. With Gratitude, GAlvira MondayMSN, FNP-BC

## 2022-01-19 NOTE — Assessment & Plan Note (Signed)
she takes otc wonderbelly antacids for symptom relief  No complaints were reported today

## 2022-01-19 NOTE — Assessment & Plan Note (Signed)
C/o of cough due to the change in wether She does not want to be started on antihistamines or any medication Encouraged symptomatic managements

## 2022-01-19 NOTE — Assessment & Plan Note (Signed)
Stable Exercise-induced asthma No refills are needed today 

## 2022-01-19 NOTE — Progress Notes (Signed)
Established Patient Office Visit  Subjective:  Patient ID: Holly Yang, female    DOB: October 21, 1975  Age: 46 y.o. MRN: [id number]  CC:  Chief Complaint  Patient presents with   Follow-up    3 month f/u, would like to discuss going up on the dose for sumatriptan.     HPI Holly Yang is a 46 y.o. female with past medical history of GERD, Migraines and Class 2 obesity presents for f/u of  chronic medical conditions.  Migraines: she takes sumatriptan 25 mg  PRN. She reports taking two tables for symptom relief.   Obesity: she reports changing her diet and working in her yard  GERD: she takes otc wonderbelly antacids for symptom relief. No complaints were reported today.      Past Medical History:  Diagnosis Date   Asthma in adult, mild intermittent, uncomplicated 51/76/1607   GERD without esophagitis 07/27/2016   High cholesterol    Irritable bowel disease    diarrhea   Migraines     Past Surgical History:  Procedure Laterality Date   CESAREAN SECTION  2007, 2010   Holly Yang, [place], [place], [place], [place]   EXCISION OF SKIN TAG N/A 04/11/2013   Procedure: EXCISION OF EXTERNAL HEMORRHOIDAL SKIN TAGS;  Surgeon: Jamesetta So, MD;  Location: [facility];  Service: General;  Laterality: N/A;   [place]    Family History  Problem Relation Age of Onset   Cancer Paternal Grandfather        skin,liver   Stroke Paternal Grandmother    Arthritis Maternal Grandmother    Other Maternal Grandfather        hemochromatosis   Other Father 70       Amyloidosis   Congestive Heart Failure Mother    Heart disease Mother    Hypertension Mother     Social History   Socioeconomic History   Marital status: Single    Spouse name: Not on file   Number of children: 2   Years of education: Bachelors   Highest education level: Not on file  Occupational History   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  Tobacco Use    Smoking status: Former    Types: Cigarettes   Smokeless tobacco: Never   Tobacco comments:    Quit 15 years ago  Electronics engineer Use   Vaping Use: Never used  Substance and Sexual Activity   Alcohol use: Not Currently    Comment: on social event   Drug use: No    Comment: Quit 15 years ago   Sexual activity: Not Currently    Birth control/protection: None, Abstinence  Other Topics Concern   Not on file  Social History Narrative   Biology degree / chemistry from [employer] at home alone   Children live at home with Holly Yang with their fathers, home with her on the weekends.   Right-handed   3-4 cups caffeine per day.   Social Determinants of Health   Financial Resource Strain: Low Risk  (10/30/2021)   Overall Financial Resource Strain (CARDIA)    Difficulty of Paying Living Expenses: Not hard at all  Food Insecurity: No Food Insecurity (10/30/2021)   Hunger Vital Sign    Worried About Running Out of Food in the Last Year: Never true    Ran Out of Food in the Last Year: Never true  Transportation Needs: No Transportation Needs (10/30/2021)   PRAPARE - Transportation  Lack of Transportation (Medical): No    Lack of Transportation (Non-Medical): No  Physical Activity: Sufficiently Active (10/30/2021)   Exercise Vital Sign    Days of Exercise per Week: 3 days    Minutes of Exercise per Session: 50 min  Stress: No Stress Concern Present (10/30/2021)   [place]    Feeling of Stress : Only a little  Social Connections: Socially Isolated (10/30/2021)   Social Connection and Isolation Panel [NHANES]    Frequency of Communication with Friends and Family: Once a week    Frequency of Social Gatherings with Friends and Family: More than three times a week    Attends Religious Services: Never    Marine scientist or Organizations: No    Attends Archivist Meetings: Never    Marital Status: Never married   Intimate Partner Violence: Not At Risk (10/30/2021)   Humiliation, Afraid, Rape, and Kick questionnaire    Fear of Current or Ex-Partner: No    Emotionally Abused: No    Physically Abused: No    Sexually Abused: No    Outpatient Medications Prior to Visit  Medication Sig Dispense Refill   albuterol (VENTOLIN HFA) 108 (90 Base) MCG/ACT inhaler INHALE 1-2 PUFF INTO THE LUNGS EVERY 6 HOURS AS NEEDED FOR WHEEZING OR SHORTNESS OF BREATH 18 g 0   SUMAtriptan (IMITREX) 25 MG tablet Take 1 tablet (25 mg total) by mouth as needed for migraine. May repeat dose at 2-hr intervals; Max 200 mg PO daily 30 tablet 0   No facility-administered medications prior to visit.    Allergies  Allergen Reactions   Topamax [Topiramate]     Gave increase headache    ROS Review of Systems  Constitutional:  Negative for fatigue and fever.  HENT:  Negative for sinus pressure and sinus pain.   Eyes:  Negative for photophobia, redness and visual disturbance.  Respiratory:  Positive for cough. Negative for shortness of breath.   Psychiatric/Behavioral:  Negative for self-injury and suicidal ideas.       Objective:    Physical Exam HENT:     Head: Normocephalic.  Cardiovascular:     Rate and Rhythm: Normal rate and regular rhythm.     Pulses: Normal pulses.     Heart sounds: Normal heart sounds.  Pulmonary:     Effort: Pulmonary effort is normal.     Breath sounds: Normal breath sounds.  Neurological:     Mental Status: She is alert.     BP 104/69   Pulse 74   Ht '5\' 5"'  (1.651 m)   Wt 230 lb 0.6 oz (104.3 kg)   SpO2 95%   BMI 38.28 kg/m  Wt Readings from Last 3 Encounters:  01/19/22 230 lb 0.6 oz (104.3 kg)  10/30/21 224 lb (101.6 kg)  10/19/21 225 lb (102.1 kg)    Lab Results  Component Value Date   TSH 3.490 10/19/2021   Lab Results  Component Value Date   WBC 6.8 10/19/2021   HGB 13.9 10/19/2021   HCT 41.8 10/19/2021   MCV 93 10/19/2021   PLT 192 10/19/2021   Lab Results   Component Value Date   NA 138 10/19/2021   K 4.1 10/19/2021   CO2 23 10/19/2021   GLUCOSE 96 10/19/2021   BUN 13 10/19/2021   CREATININE 0.82 10/19/2021   BILITOT 0.4 10/19/2021   ALKPHOS 71 10/19/2021   AST 16 10/19/2021   ALT 18 10/19/2021  PROT 6.8 10/19/2021   ALBUMIN 4.4 10/19/2021   CALCIUM 8.8 10/19/2021   ANIONGAP 8 07/24/2015   EGFR 89 10/19/2021   Lab Results  Component Value Date   CHOL 223 (H) 10/19/2021   Lab Results  Component Value Date   HDL 66 10/19/2021   Lab Results  Component Value Date   LDLCALC 136 (H) 10/19/2021   Lab Results  Component Value Date   TRIG 121 10/19/2021   Lab Results  Component Value Date   CHOLHDL 3.4 10/19/2021   Lab Results  Component Value Date   HGBA1C 5.7 (H) 10/19/2021      Assessment & Plan:   Problem List Items Addressed This Visit       Cardiovascular and Mediastinum   Migraine headache    Will increase sumatriptan to 50 mg PRN Encouraged to continue treatment for migraines      Relevant Medications   SUMAtriptan (IMITREX) 50 MG tablet     Respiratory   Asthma in adult, mild intermittent, uncomplicated    Stable Exercise-induced asthma No refills are needed today        Digestive   GERD without esophagitis    she takes otc wonderbelly antacids for symptom relief  No complaints were reported today        Other   Cough    C/o of cough due to the change in wether She does not want to be started on antihistamines or any medication Encouraged symptomatic managements      Need for immunization against influenza    Patient educated on CDC recommendation for the vaccine. Verbal consent was obtained from the patient, vaccine administered by nurse, no sign of adverse reactions noted at this time. Patient education on arm soreness and use of tylenol or ibuprofen for this patient  was discussed. Patient educated on the signs and symptoms of adverse effect and advise to contact the office if they  occur.       Other Visit Diagnoses     Flu vaccine need    -  Primary   Relevant Orders   Flu Vaccine QUAD 6+ mos PF IM (Fluarix Quad PF) (Completed)   Vitamin D deficiency       Relevant Orders   Vitamin D (25 hydroxy)   IFG (impaired fasting glucose)       Relevant Orders   CBC with Differential/Platelet   CMP14+EGFR   TSH + free T4   Lipid panel   Hemoglobin A1C       Meds ordered this encounter  Medications   SUMAtriptan (IMITREX) 50 MG tablet    Sig: Take 1 tablet (50 mg total) by mouth every 2 (two) hours as needed for migraine. May repeat in 2 hours if headache persists or recurs.    Dispense:  20 tablet    Refill:  0    Follow-up: Return in about 4 months (around 05/21/2022).    Alvira Monday, FNP

## 2022-01-22 LAB — CMP14+EGFR
ALT: 23 IU/L (ref 0–32)
AST: 21 IU/L (ref 0–40)
Albumin/Globulin Ratio: 1.8 (ref 1.2–2.2)
Albumin: 4.4 g/dL (ref 3.9–4.9)
Alkaline Phosphatase: 63 IU/L (ref 44–121)
BUN/Creatinine Ratio: 14 (ref 9–23)
BUN: 14 mg/dL (ref 6–24)
Bilirubin Total: 0.3 mg/dL (ref 0.0–1.2)
CO2: 24 mmol/L (ref 20–29)
Calcium: 9.6 mg/dL (ref 8.7–10.2)
Chloride: 104 mmol/L (ref 96–106)
Creatinine, Ser: 0.98 mg/dL (ref 0.57–1.00)
Globulin, Total: 2.4 g/dL (ref 1.5–4.5)
Glucose: 101 mg/dL — ABNORMAL HIGH (ref 70–99)
Potassium: 5 mmol/L (ref 3.5–5.2)
Sodium: 141 mmol/L (ref 134–144)
Total Protein: 6.8 g/dL (ref 6.0–8.5)
eGFR: 72 mL/min/{1.73_m2} (ref >59)

## 2022-01-22 LAB — CBC WITH DIFFERENTIAL/PLATELET
Basophils Absolute: 0 10*3/uL (ref 0.0–0.2)
Basos: 0 %
EOS (ABSOLUTE): 0.2 10*3/uL (ref 0.0–0.4)
Eos: 2 %
Hematocrit: 40.4 % (ref 34.0–46.6)
Hemoglobin: 13.6 g/dL (ref 11.1–15.9)
Immature Grans (Abs): 0 10*3/uL (ref 0.0–0.1)
Immature Granulocytes: 0 %
Lymphocytes Absolute: 2.5 10*3/uL (ref 0.7–3.1)
Lymphs: 39 %
MCH: 31.1 pg (ref 26.6–33.0)
MCHC: 33.7 g/dL (ref 31.5–35.7)
MCV: 92 fL (ref 79–97)
Monocytes Absolute: 0.5 10*3/uL (ref 0.1–0.9)
Monocytes: 8 %
Neutrophils Absolute: 3.2 10*3/uL (ref 1.4–7.0)
Neutrophils: 51 %
Platelets: 218 10*3/uL (ref 150–450)
RBC: 4.38 x10E6/uL (ref 3.77–5.28)
RDW: 12.9 % (ref 11.7–15.4)
WBC: 6.3 10*3/uL (ref 3.4–10.8)

## 2022-01-22 LAB — TSH+FREE T4
Free T4: 1.21 ng/dL (ref 0.82–1.77)
TSH: 3.08 u[IU]/mL (ref 0.450–4.500)

## 2022-01-22 LAB — HEMOGLOBIN A1C
Est. average glucose Bld gHb Est-mCnc: 114 mg/dL
Hgb A1c MFr Bld: 5.6 % (ref 4.8–5.6)

## 2022-01-22 LAB — LIPID PANEL
Chol/HDL Ratio: 3.1 ratio (ref 0.0–4.4)
Cholesterol, Total: 217 mg/dL — ABNORMAL HIGH (ref 100–199)
HDL: 70 mg/dL (ref >39)
LDL Chol Calc (NIH): 126 mg/dL — ABNORMAL HIGH (ref 0–99)
Triglycerides: 120 mg/dL (ref 0–149)
VLDL Cholesterol Cal: 21 mg/dL (ref 5–40)

## 2022-01-22 LAB — VITAMIN D 25 HYDROXY (VIT D DEFICIENCY, FRACTURES): Vit D, 25-Hydroxy: 41.4 ng/mL (ref 30.0–100.0)

## 2022-01-27 NOTE — Progress Notes (Signed)
Please inform the patient that her cholesterol is elevated. I want her LDL to be less than 100. I recommend low carbs and fat diet.

## 2022-02-01 ENCOUNTER — Telehealth: Payer: Self-pay | Admitting: Family Medicine

## 2022-02-01 ENCOUNTER — Encounter: Payer: Self-pay | Admitting: Family Medicine

## 2022-02-01 NOTE — Telephone Encounter (Signed)
Pt called stating she missed a call & was returning the call?

## 2022-02-01 NOTE — Telephone Encounter (Signed)
Spoke to pt went over labs.

## 2022-04-06 ENCOUNTER — Other Ambulatory Visit (HOSPITAL_COMMUNITY): Payer: Self-pay

## 2022-04-06 ENCOUNTER — Other Ambulatory Visit: Payer: Self-pay | Admitting: Family Medicine

## 2022-04-06 DIAGNOSIS — G43009 Migraine without aura, not intractable, without status migrainosus: Secondary | ICD-10-CM

## 2022-04-06 MED ORDER — SUMATRIPTAN SUCCINATE 50 MG PO TABS
50.0000 mg | ORAL_TABLET | ORAL | 0 refills | Status: DC | PRN
  Filled 2022-04-06: qty 9, 18d supply, fill #0
  Filled 2022-04-20: qty 20, 5d supply, fill #0

## 2022-04-07 ENCOUNTER — Encounter (HOSPITAL_COMMUNITY): Payer: Self-pay

## 2022-04-07 ENCOUNTER — Other Ambulatory Visit (HOSPITAL_COMMUNITY): Payer: Self-pay

## 2022-04-09 ENCOUNTER — Other Ambulatory Visit (HOSPITAL_COMMUNITY): Payer: Self-pay

## 2022-04-13 ENCOUNTER — Other Ambulatory Visit (HOSPITAL_COMMUNITY): Payer: Self-pay

## 2022-04-20 ENCOUNTER — Other Ambulatory Visit (HOSPITAL_COMMUNITY): Payer: Self-pay

## 2022-04-20 ENCOUNTER — Other Ambulatory Visit: Payer: Self-pay

## 2022-05-24 ENCOUNTER — Encounter: Payer: Self-pay | Admitting: Family Medicine

## 2022-05-24 ENCOUNTER — Ambulatory Visit: Payer: 59 | Admitting: Family Medicine

## 2022-05-24 VITALS — BP 124/82 | HR 73 | Ht 65.0 in | Wt 234.8 lb

## 2022-05-24 DIAGNOSIS — J4520 Mild intermittent asthma, uncomplicated: Secondary | ICD-10-CM | POA: Diagnosis not present

## 2022-05-24 DIAGNOSIS — E559 Vitamin D deficiency, unspecified: Secondary | ICD-10-CM

## 2022-05-24 DIAGNOSIS — G43109 Migraine with aura, not intractable, without status migrainosus: Secondary | ICD-10-CM | POA: Diagnosis not present

## 2022-05-24 DIAGNOSIS — E038 Other specified hypothyroidism: Secondary | ICD-10-CM | POA: Diagnosis not present

## 2022-05-24 DIAGNOSIS — E785 Hyperlipidemia, unspecified: Secondary | ICD-10-CM

## 2022-05-24 DIAGNOSIS — R7301 Impaired fasting glucose: Secondary | ICD-10-CM

## 2022-05-24 NOTE — Assessment & Plan Note (Signed)
Stable Exercise-induced asthma No refills are needed today

## 2022-05-24 NOTE — Progress Notes (Signed)
Established Patient Office Visit  Subjective:  Patient ID: Holly Yang, female    DOB: July 01, 1975  Age: 47 y.o. MRN: [id number]  CC:  Chief Complaint  Patient presents with   Follow-up    4 month f/u    HPI Holly Yang is a 47 y.o. female with past medical history of migraine headaches, asthma in adult, GERD, IBS, and cough presents for f/u of  chronic medical conditions. For the details of today's visit, please refer to the assessment and plan.     Past Medical History:  Diagnosis Date   Asthma in adult, mild intermittent, uncomplicated 88/50/2774   GERD without esophagitis 07/27/2016   High cholesterol    Irritable bowel disease    diarrhea   Migraines     Past Surgical History:  Procedure Laterality Date   CESAREAN SECTION  2007, 2010   Cecille Rubin, [place], [place], [place], [place]   EXCISION OF SKIN TAG N/A 04/11/2013   Procedure: EXCISION OF EXTERNAL HEMORRHOIDAL SKIN TAGS;  Surgeon: Jamesetta So, MD;  Location: [facility];  Service: General;  Laterality: N/A;   [place]    Family History  Problem Relation Age of Onset   Cancer Paternal Grandfather        skin,liver   Stroke Paternal Grandmother    Arthritis Maternal Grandmother    Other Maternal Grandfather        hemochromatosis   Other Father 34       Amyloidosis   Congestive Heart Failure Mother    Heart disease Mother    Hypertension Mother     Social History   Socioeconomic History   Marital status: Single    Spouse name: Not on file   Number of children: 2   Years of education: Bachelors   Highest education level: Not on file  Occupational History   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  Tobacco Use   Smoking status: Former    Types: Cigarettes   Smokeless tobacco: Never   Tobacco comments:    Quit 15 years ago  Electronics engineer Use   Vaping Use: Never used  Substance and Sexual Activity   Alcohol use: Not Currently    Comment:  on social event   Drug use: No    Comment: Quit 15 years ago   Sexual activity: Not Currently    Birth control/protection: None, Abstinence  Other Topics Concern   Not on file  Social History Narrative   Biology degree / chemistry from [employer] at home alone   Children live at home with [place] with their fathers, home with her on the weekends.   Right-handed   3-4 cups caffeine per day.   Social Determinants of Health   Financial Resource Strain: Low Risk  (10/30/2021)   Overall Financial Resource Strain (CARDIA)    Difficulty of Paying Living Expenses: Not hard at all  Food Insecurity: No Food Insecurity (10/30/2021)   Hunger Vital Sign    Worried About Running Out of Food in the Last Year: Never true    Ran Out of Food in the Last Year: Never true  Transportation Needs: No Transportation Needs (10/30/2021)   PRAPARE - Hydrologist (Medical): No    Lack of Transportation (Non-Medical): No  Physical Activity: Sufficiently Active (10/30/2021)   Exercise Vital Sign    Days of Exercise per Week: 3 days    Minutes of  Exercise per Session: 50 min  Stress: No Stress Concern Present (10/30/2021)   [place]    Feeling of Stress : Only a little  Social Connections: Socially Isolated (10/30/2021)   Social Connection and Isolation Panel [NHANES]    Frequency of Communication with Friends and Family: Once a week    Frequency of Social Gatherings with Friends and Family: More than three times a week    Attends Religious Services: Never    Marine scientist or Organizations: No    Attends Archivist Meetings: Never    Marital Status: Never married  Intimate Partner Violence: Not At Risk (10/30/2021)   Humiliation, Afraid, Rape, and Kick questionnaire    Fear of Current or Ex-Partner: No    Emotionally Abused: No    Physically Abused: No    Sexually Abused: No    Outpatient  Medications Prior to Visit  Medication Sig Dispense Refill   albuterol (VENTOLIN HFA) 108 (90 Base) MCG/ACT inhaler INHALE 1-2 PUFF INTO THE LUNGS EVERY 6 HOURS AS NEEDED FOR WHEEZING OR SHORTNESS OF BREATH 18 g 0   SUMAtriptan (IMITREX) 50 MG tablet Take 1 tablet (50 mg total) by mouth as needed for migraine. May repeat in 2 hours if headache persists or recurs. 20 tablet 0   No facility-administered medications prior to visit.    Allergies  Allergen Reactions   Topamax [Topiramate]     Gave increase headache    ROS Review of Systems  Constitutional:  Negative for chills and fever.  Eyes:  Negative for visual disturbance.  Respiratory:  Negative for chest tightness and shortness of breath.   Neurological:  Negative for dizziness and headaches.      Objective:    Physical Exam HENT:     Head: Normocephalic.     Mouth/Throat:     Mouth: Mucous membranes are moist.  Cardiovascular:     Rate and Rhythm: Normal rate.     Heart sounds: Normal heart sounds.  Pulmonary:     Effort: Pulmonary effort is normal.     Breath sounds: Normal breath sounds.  Neurological:     Mental Status: She is alert.     BP 124/82   Pulse 73   Ht '5\' 5"'$  (1.651 m)   Wt 234 lb 12.8 oz (106.5 kg)   SpO2 97%   BMI 39.07 kg/m  Wt Readings from Last 3 Encounters:  05/24/22 234 lb 12.8 oz (106.5 kg)  01/19/22 230 lb 0.6 oz (104.3 kg)  10/30/21 224 lb (101.6 kg)    Lab Results  Component Value Date   TSH 3.080 01/21/2022   Lab Results  Component Value Date   WBC 6.3 01/21/2022   HGB 13.6 01/21/2022   HCT 40.4 01/21/2022   MCV 92 01/21/2022   PLT 218 01/21/2022   Lab Results  Component Value Date   NA 141 01/21/2022   K 5.0 01/21/2022   CO2 24 01/21/2022   GLUCOSE 101 (H) 01/21/2022   BUN 14 01/21/2022   CREATININE 0.98 01/21/2022   BILITOT 0.3 01/21/2022   ALKPHOS 63 01/21/2022   AST 21 01/21/2022   ALT 23 01/21/2022   PROT 6.8 01/21/2022   ALBUMIN 4.4 01/21/2022   CALCIUM  9.6 01/21/2022   ANIONGAP 8 07/24/2015   EGFR 72 01/21/2022   Lab Results  Component Value Date   CHOL 217 (H) 01/21/2022   Lab Results  Component Value Date  HDL 70 01/21/2022   Lab Results  Component Value Date   LDLCALC 126 (H) 01/21/2022   Lab Results  Component Value Date   TRIG 120 01/21/2022   Lab Results  Component Value Date   CHOLHDL 3.1 01/21/2022   Lab Results  Component Value Date   HGBA1C 5.6 01/21/2022      Assessment & Plan:  Migraine with aura and without status migrainosus, not intractable Assessment & Plan: Stable She reports taking sumatriptan 50 mg for abortive treatment No complaints or concerns today   Asthma in adult, mild intermittent, uncomplicated Assessment & Plan: Stable Exercise-induced asthma No refills are needed today   Hyperlipidemia, unspecified hyperlipidemia type -     Lipid panel -     CMP14+EGFR -     CBC with Differential/Platelet  Other specified hypothyroidism -     TSH + free T4  Vitamin D deficiency -     VITAMIN D 25 Hydroxy (Vit-D Deficiency, Fractures)  IFG (impaired fasting glucose) -     Hemoglobin A1c    Follow-up: Return in about 3 months (around 08/23/2022).   Alvira Monday, FNP

## 2022-05-24 NOTE — Assessment & Plan Note (Signed)
Stable She reports taking sumatriptan 50 mg for abortive treatment No complaints or concerns today

## 2022-05-24 NOTE — Patient Instructions (Addendum)
   I appreciate the opportunity to provide care to you today!    Follow up:  3 months  Labs: please stop by the lab during the weeks to get your blood drawn (CBC, CMP, TSH, Lipid profile, HgA1c, Vit D)  Please schedule Mammogram    Please continue to a heart-healthy diet and increase your physical activities. Try to exercise for 87mns at least five times a week.      It was a pleasure to see you and I look forward to continuing to work together on your health and well-being. Please do not hesitate to call the office if you need care or have questions about your care.   Have a wonderful day and week. With Gratitude, GAlvira MondayMSN, FNP-BC

## 2022-06-09 ENCOUNTER — Ambulatory Visit (HOSPITAL_COMMUNITY)
Admission: RE | Admit: 2022-06-09 | Discharge: 2022-06-09 | Disposition: A | Discharge status: Home / Self Care | Payer: 59 | Source: Ambulatory Visit | Attending: Family Medicine | Admitting: Family Medicine

## 2022-06-09 DIAGNOSIS — Z1231 Encounter for screening mammogram for malignant neoplasm of breast: Secondary | ICD-10-CM | POA: Insufficient documentation

## 2022-08-23 ENCOUNTER — Ambulatory Visit: Payer: 59 | Admitting: Family Medicine

## 2022-08-25 ENCOUNTER — Other Ambulatory Visit: Payer: Self-pay | Admitting: Family Medicine

## 2022-08-25 ENCOUNTER — Other Ambulatory Visit (HOSPITAL_COMMUNITY): Payer: Self-pay

## 2022-08-25 DIAGNOSIS — G43009 Migraine without aura, not intractable, without status migrainosus: Secondary | ICD-10-CM

## 2022-08-25 MED ORDER — SUMATRIPTAN SUCCINATE 50 MG PO TABS
50.0000 mg | ORAL_TABLET | ORAL | 0 refills | Status: DC | PRN
  Filled 2022-08-25: qty 9, 30d supply, fill #0
  Filled 2022-09-09: qty 9, 30d supply, fill #1

## 2022-09-09 ENCOUNTER — Other Ambulatory Visit (HOSPITAL_COMMUNITY): Payer: Self-pay

## 2022-09-17 ENCOUNTER — Ambulatory Visit: Payer: 59 | Admitting: Family Medicine

## 2022-09-17 ENCOUNTER — Encounter: Payer: Self-pay | Admitting: Family Medicine

## 2022-09-17 VITALS — BP 102/70 | HR 70 | Ht 65.0 in | Wt 233.0 lb

## 2022-09-17 DIAGNOSIS — G43009 Migraine without aura, not intractable, without status migrainosus: Secondary | ICD-10-CM

## 2022-09-17 DIAGNOSIS — E6609 Other obesity due to excess calories: Secondary | ICD-10-CM

## 2022-09-17 DIAGNOSIS — M7660 Achilles tendinitis, unspecified leg: Secondary | ICD-10-CM | POA: Insufficient documentation

## 2022-09-17 DIAGNOSIS — Z6838 Body mass index (BMI) 38.0-38.9, adult: Secondary | ICD-10-CM | POA: Diagnosis not present

## 2022-09-17 DIAGNOSIS — M7662 Achilles tendinitis, left leg: Secondary | ICD-10-CM | POA: Diagnosis not present

## 2022-09-17 LAB — CMP14+EGFR
ALT: 22 IU/L (ref 0–32)
AST: 19 IU/L (ref 0–40)
Albumin/Globulin Ratio: 1.8 (ref 1.2–2.2)
Albumin: 4.2 g/dL (ref 3.9–4.9)
Alkaline Phosphatase: 64 IU/L (ref 44–121)
BUN/Creatinine Ratio: 22 (ref 9–23)
BUN: 19 mg/dL (ref 6–24)
Bilirubin Total: 0.2 mg/dL (ref 0.0–1.2)
CO2: 20 mmol/L (ref 20–29)
Calcium: 9.3 mg/dL (ref 8.7–10.2)
Chloride: 105 mmol/L (ref 96–106)
Creatinine, Ser: 0.87 mg/dL (ref 0.57–1.00)
Globulin, Total: 2.4 g/dL (ref 1.5–4.5)
Glucose: 92 mg/dL (ref 70–99)
Potassium: 4.5 mmol/L (ref 3.5–5.2)
Sodium: 142 mmol/L (ref 134–144)
Total Protein: 6.6 g/dL (ref 6.0–8.5)
eGFR: 82 mL/min/{1.73_m2} (ref >59)

## 2022-09-17 LAB — HEMOGLOBIN A1C
Est. average glucose Bld gHb Est-mCnc: 120 mg/dL
Hgb A1c MFr Bld: 5.8 % — ABNORMAL HIGH (ref 4.8–5.6)

## 2022-09-17 LAB — LIPID PANEL
Chol/HDL Ratio: 3.4 ratio (ref 0.0–4.4)
Cholesterol, Total: 224 mg/dL — ABNORMAL HIGH (ref 100–199)
HDL: 66 mg/dL (ref >39)
LDL Chol Calc (NIH): 139 mg/dL — ABNORMAL HIGH (ref 0–99)
Triglycerides: 107 mg/dL (ref 0–149)
VLDL Cholesterol Cal: 19 mg/dL (ref 5–40)

## 2022-09-17 LAB — CBC WITH DIFFERENTIAL/PLATELET
Basophils Absolute: 0 10*3/uL (ref 0.0–0.2)
Basos: 0 %
EOS (ABSOLUTE): 0.1 10*3/uL (ref 0.0–0.4)
Eos: 1 %
Hematocrit: 40.6 % (ref 34.0–46.6)
Hemoglobin: 13.5 g/dL (ref 11.1–15.9)
Immature Grans (Abs): 0 10*3/uL (ref 0.0–0.1)
Immature Granulocytes: 0 %
Lymphocytes Absolute: 3 10*3/uL (ref 0.7–3.1)
Lymphs: 42 %
MCH: 31 pg (ref 26.6–33.0)
MCHC: 33.3 g/dL (ref 31.5–35.7)
MCV: 93 fL (ref 79–97)
Monocytes Absolute: 0.5 10*3/uL (ref 0.1–0.9)
Monocytes: 7 %
Neutrophils Absolute: 3.6 10*3/uL (ref 1.4–7.0)
Neutrophils: 50 %
Platelets: 188 10*3/uL (ref 150–450)
RBC: 4.36 x10E6/uL (ref 3.77–5.28)
RDW: 13 % (ref 11.7–15.4)
WBC: 7.2 10*3/uL (ref 3.4–10.8)

## 2022-09-17 LAB — TSH+FREE T4
Free T4: 1.16 ng/dL (ref 0.82–1.77)
TSH: 4.42 u[IU]/mL (ref 0.450–4.500)

## 2022-09-17 LAB — VITAMIN D 25 HYDROXY (VIT D DEFICIENCY, FRACTURES): Vit D, 25-Hydroxy: 39.4 ng/mL (ref 30.0–100.0)

## 2022-09-17 MED ORDER — SUMATRIPTAN SUCCINATE 50 MG PO TABS: 50.0000 mg | ORAL_TABLET | ORAL | 0 refills | Status: DC | PRN

## 2022-09-17 NOTE — Patient Instructions (Signed)
I appreciate the opportunity to provide care to you today!    Follow up:  3 months  Labs: next visit     Please continue to a heart-healthy diet and increase your physical activities. Try to exercise for 30mins at least five days a week.      It was a pleasure to see you and I look forward to continuing to work together on your health and well-being. Please do not hesitate to call the office if you need care or have questions about your care.   Have a wonderful day and week. With Gratitude, Cariana Karge MSN, FNP-BC  

## 2022-09-17 NOTE — Assessment & Plan Note (Signed)
Wants to discuss weight loss options Encouraged lifestyle modification with heart healthy diet and increase physical activity My weight management plan reviewed with the patient Wt Readings from Last 3 Encounters:  09/17/22 233 lb 0.6 oz (105.7 kg)  05/24/22 234 lb 12.8 oz (106.5 kg)  01/19/22 230 lb 0.6 oz (104.3 kg)

## 2022-09-17 NOTE — Assessment & Plan Note (Addendum)
She reports injury of her Achilles tendon in 2016 and since she occasionally has flareups  Pain is noticeable the next day after prolonged activity  Pain is usually worse in the morning She reports history of plantar fasciitis in the left heel which has resolved Reports not taking any medication for pain relief when she has flareups Reports minimal relief of her symptoms when she took over-the-counter NSAIDs Declines corticosteroid injections  Normal gait in the clinic She is interested in Tenex procedure and will provide the name of the provider and practice to be referred

## 2022-09-17 NOTE — Progress Notes (Signed)
Established Patient Office Visit  Subjective:  Patient ID: Holly Yang, female    DOB: 13-Jul-1975  Age: 47 y.o. MRN: [id number]  CC:  Chief Complaint  Patient presents with   Chronic Care Management    3 month f/u. , would like to discuss weight gain, drinks a lot of water, does some walking.    Results    Had labs done yesterday needs to have this reviewed.     HPI Holly Yang is a 47 y.o. female with past medical history of migraines and obesity presents for f/u of  chronic medical conditions. For the details of today's visit, please refer to the assessment and plan.     The 10-year ASCVD risk score (Arnett DK, et al., 2019) is: 0.6%   Values used to calculate the score:     Age: 89 years     Sex: Female     Is Non-Hispanic African American: No     Diabetic: No     Tobacco smoker: No     Systolic Blood Pressure: 102 mmHg     Is BP treated: No     HDL Cholesterol: 66 mg/dL     Total Cholesterol: 224 mg/dL   Past Medical History:  Diagnosis Date   Asthma in adult, mild intermittent, uncomplicated 08/30/2016   GERD without esophagitis 07/27/2016   High cholesterol    Irritable bowel disease    diarrhea   Migraines     Past Surgical History:  Procedure Laterality Date   CESAREAN SECTION  2007, 2010   [facility], [place], [place] & [place], [place], [place]   EXCISION OF SKIN TAG N/A 04/11/2013   Procedure: EXCISION OF EXTERNAL HEMORRHOIDAL SKIN TAGS;  Surgeon: Dalia Heading, MD;  Location: [facility];  Service: General;  Laterality: N/A;   WISDOM TOOTH EXTRACTION  1996    Family History  Problem Relation Age of Onset   Cancer Paternal Grandfather        skin,liver   Stroke Paternal Grandmother    Arthritis Maternal Grandmother    Other Maternal Grandfather        hemochromatosis   Other Father 51       Amyloidosis   Congestive Heart Failure Mother    Heart disease Mother    Hypertension Mother     Social History   Socioeconomic  History   Marital status: Single    Spouse name: Not on file   Number of children: 2   Years of education: Bachelors   Highest education level: Not on file  Occupational History   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  Tobacco Use   Smoking status: Former    Types: Cigarettes   Smokeless tobacco: Never   Tobacco comments:    Quit 15 years ago  Advertising account planner   Vaping Use: Never used  Substance and Sexual Activity   Alcohol use: Not Currently    Comment: on social event   Drug use: No    Comment: Quit 15 years ago   Sexual activity: Not Currently    Birth control/protection: None, Abstinence  Other Topics Concern   Not on file  Social History Narrative   Biology degree / chemistry from [employer]   Lives at home alone   Children live at home with [place] with their fathers, home with her on the weekends.   Right-handed   3-4 cups caffeine per day.   Social Determinants of Corporate investment banker  Strain: Low Risk  (10/30/2021)   Overall Financial Resource Strain (CARDIA)    Difficulty of Paying Living Expenses: Not hard at all  Food Insecurity: No Food Insecurity (10/30/2021)   Hunger Vital Sign    Worried About Running Out of Food in the Last Year: Never true    Ran Out of Food in the Last Year: Never true  Transportation Needs: No Transportation Needs (10/30/2021)   PRAPARE - Administrator, Civil Service (Medical): No    Lack of Transportation (Non-Medical): No  Physical Activity: Sufficiently Active (10/30/2021)   Exercise Vital Sign    Days of Exercise per Week: 3 days    Minutes of Exercise per Session: 50 min  Stress: No Stress Concern Present (10/30/2021)   [employer] of Occupational Health - Occupational Stress Questionnaire    Feeling of Stress : Only a little  Social Connections: Socially Isolated (10/30/2021)   Social Connection and Isolation Panel [NHANES]    Frequency of Communication with Friends and Family: Once a week     Frequency of Social Gatherings with Friends and Family: More than three times a week    Attends Religious Services: Never    Database administrator or Organizations: No    Attends Banker Meetings: Never    Marital Status: Never married  Intimate Partner Violence: Not At Risk (10/30/2021)   Humiliation, Afraid, Rape, and Kick questionnaire    Fear of Current or Ex-Partner: No    Emotionally Abused: No    Physically Abused: No    Sexually Abused: No    Outpatient Medications Prior to Visit  Medication Sig Dispense Refill   albuterol (VENTOLIN HFA) 108 (90 Base) MCG/ACT inhaler INHALE 1-2 PUFF INTO THE LUNGS EVERY 6 HOURS AS NEEDED FOR WHEEZING OR SHORTNESS OF BREATH 18 g 0   SUMAtriptan (IMITREX) 50 MG tablet Take 1 tablet (50 mg total) by mouth as needed for migraine. May repeat in 2 hours if headache persists or recurs. 20 tablet 0   No facility-administered medications prior to visit.    Allergies  Allergen Reactions   Topamax [Topiramate]     Gave increase headache    ROS Review of Systems  Constitutional:  Negative for chills and fever.  Eyes:  Negative for visual disturbance.  Respiratory:  Negative for chest tightness and shortness of breath.   Musculoskeletal:        Left achilles pain  Neurological:  Negative for dizziness and headaches.      Objective:    Physical Exam HENT:     Head: Normocephalic.     Mouth/Throat:     Mouth: Mucous membranes are moist.  Cardiovascular:     Rate and Rhythm: Normal rate.     Heart sounds: Normal heart sounds.  Pulmonary:     Effort: Pulmonary effort is normal.     Breath sounds: Normal breath sounds.  Neurological:     Mental Status: She is alert.     BP 102/70   Pulse 70   Ht 5\' 5"  (1.651 m)   Wt 233 lb 0.6 oz (105.7 kg)   SpO2 96%   BMI 38.78 kg/m  Wt Readings from Last 3 Encounters:  09/17/22 233 lb 0.6 oz (105.7 kg)  05/24/22 234 lb 12.8 oz (106.5 kg)  01/19/22 230 lb 0.6 oz (104.3 kg)     Lab Results  Component Value Date   TSH 4.420 09/16/2022   Lab Results  Component Value Date  WBC 7.2 09/16/2022   HGB 13.5 09/16/2022   HCT 40.6 09/16/2022   MCV 93 09/16/2022   PLT 188 09/16/2022   Lab Results  Component Value Date   NA 142 09/16/2022   K 4.5 09/16/2022   CO2 20 09/16/2022   GLUCOSE 92 09/16/2022   BUN 19 09/16/2022   CREATININE 0.87 09/16/2022   BILITOT 0.2 09/16/2022   ALKPHOS 64 09/16/2022   AST 19 09/16/2022   ALT 22 09/16/2022   PROT 6.6 09/16/2022   ALBUMIN 4.2 09/16/2022   CALCIUM 9.3 09/16/2022   ANIONGAP 8 07/24/2015   EGFR 82 09/16/2022   Lab Results  Component Value Date   CHOL 224 (H) 09/16/2022   Lab Results  Component Value Date   HDL 66 09/16/2022   Lab Results  Component Value Date   LDLCALC 139 (H) 09/16/2022   Lab Results  Component Value Date   TRIG 107 09/16/2022   Lab Results  Component Value Date   CHOLHDL 3.4 09/16/2022   Lab Results  Component Value Date   HGBA1C 5.8 (H) 09/16/2022      Assessment & Plan:  Left Achilles tendinitis Assessment & Plan: She reports injury of her Achilles tendon in 2016 and since she occasionally has flareups  Pain is noticeable the next day after prolonged activity  Pain is usually worse in the morning She reports history of plantar fasciitis in the left heel which has resolved Reports not taking any medication for pain relief when she has flareups Reports minimal relief of her symptoms when she took over-the-counter NSAIDs Declines corticosteroid injections  Normal gait in the clinic She is interested in Tenex procedure and will provide the name of the provider and practice to be referred    Class 2 obesity due to excess calories without serious comorbidity with body mass index (BMI) of 38.0 to 38.9 in adult Assessment & Plan: Wants to discuss weight loss options Encouraged lifestyle modification with heart healthy diet and increase physical activity My weight  management plan reviewed with the patient Wt Readings from Last 3 Encounters:  09/17/22 233 lb 0.6 oz (105.7 kg)  05/24/22 234 lb 12.8 oz (106.5 kg)  01/19/22 230 lb 0.6 oz (104.3 kg)      Migraine without aura and without status migrainosus, not intractable -     SUMAtriptan Succinate; Take 1 tablet (50 mg total) by mouth as needed for migraine. May repeat in 2 hours if headache persists or recurs.  Dispense: 20 tablet; Refill: 0    Follow-up: Return in about 3 months (around 12/18/2022).   Gilmore Laroche, FNP

## 2022-09-18 ENCOUNTER — Encounter: Payer: Self-pay | Admitting: Family Medicine

## 2022-09-20 ENCOUNTER — Other Ambulatory Visit: Payer: Self-pay

## 2022-09-20 DIAGNOSIS — M7662 Achilles tendinitis, left leg: Secondary | ICD-10-CM

## 2022-09-20 NOTE — Telephone Encounter (Signed)
Thank you :)

## 2022-10-01 ENCOUNTER — Telehealth: Payer: Self-pay | Admitting: Family Medicine

## 2022-10-01 NOTE — Telephone Encounter (Signed)
Tried to resubmit PA, states Prior Berkley Harvey is not needed, called the pharmacy to confirm, said medication went through will get it ready for pt to pick up, pt has been made aware of this.

## 2022-10-01 NOTE — Telephone Encounter (Signed)
Patient came by the office saying pharmacy said her insurance is denying this medicine insurance saying provider said patient has only 4 headaches a month, patient needs to speak to nurse saying their is a miscommunication somehwere. Sent message in teams to nurse  SUMAtriptan (IMITREX) 50 MG tablet [[id number]]   Pharmacy  [id number] - [place], [place] - [address] SEC OF [address], [place] [address] Phone: [phone]  Fax: [phone] DEA #: [id number]

## 2022-12-13 ENCOUNTER — Other Ambulatory Visit: Payer: Self-pay | Admitting: Family Medicine

## 2022-12-13 DIAGNOSIS — G43009 Migraine without aura, not intractable, without status migrainosus: Secondary | ICD-10-CM

## 2022-12-14 MED ORDER — SUMATRIPTAN SUCCINATE 50 MG PO TABS: 50.0000 mg | ORAL_TABLET | ORAL | 0 refills | Status: DC | PRN

## 2022-12-20 ENCOUNTER — Ambulatory Visit: Payer: 59 | Admitting: Family Medicine

## 2022-12-20 ENCOUNTER — Encounter: Payer: Self-pay | Admitting: Family Medicine

## 2022-12-20 VITALS — BP 116/78 | HR 77 | Ht 65.0 in | Wt 227.0 lb

## 2022-12-20 DIAGNOSIS — E559 Vitamin D deficiency, unspecified: Secondary | ICD-10-CM

## 2022-12-20 DIAGNOSIS — R7301 Impaired fasting glucose: Secondary | ICD-10-CM | POA: Diagnosis not present

## 2022-12-20 DIAGNOSIS — G43009 Migraine without aura, not intractable, without status migrainosus: Secondary | ICD-10-CM | POA: Diagnosis not present

## 2022-12-20 DIAGNOSIS — M7662 Achilles tendinitis, left leg: Secondary | ICD-10-CM

## 2022-12-20 DIAGNOSIS — E038 Other specified hypothyroidism: Secondary | ICD-10-CM

## 2022-12-20 DIAGNOSIS — E7849 Other hyperlipidemia: Secondary | ICD-10-CM

## 2022-12-20 MED ORDER — SUMATRIPTAN SUCCINATE 50 MG PO TABS: 50.0000 mg | ORAL_TABLET | ORAL | 0 refills | Status: DC | PRN

## 2022-12-20 NOTE — Assessment & Plan Note (Addendum)
The patient has reported a reduction in the frequency of migraine headaches, indicating improved stability. To continue managing the condition effectively, a refill of sumatriptan is recommended. Additionally, the patient is encouraged to increase physical activity and prioritize rest and relaxation, as stress is a known trigger for migraines. Implementing these lifestyle adjustments may contribute further to reducing the incidence of headaches

## 2022-12-20 NOTE — Progress Notes (Addendum)
Established Patient Office Visit  Subjective:  Patient ID: Holly Yang, female    DOB: 06/27/1975  Age: 47 y.o. MRN: [id number]  CC:  Chief Complaint  Patient presents with   Care Management    3 month f/u, has not received a call from ortho surgery yet.     HPI Holly Yang is a 47 y.o. female with past medical history of Migraines presents for f/u of  chronic medical conditions. For the details of today's visit, please refer to the assessment and plan.     Past Medical History:  Diagnosis Date   Asthma in adult, mild intermittent, uncomplicated 08/30/2016   GERD without esophagitis 07/27/2016   High cholesterol    Irritable bowel disease    diarrhea   Migraines     Past Surgical History:  Procedure Laterality Date   CESAREAN SECTION  2007, 2010   [facility], [place], [place] & [place], [place], [place]   EXCISION OF SKIN TAG N/A 04/11/2013   Procedure: EXCISION OF EXTERNAL HEMORRHOIDAL SKIN TAGS;  Surgeon: Dalia Heading, MD;  Location: [facility];  Service: General;  Laterality: N/A;   WISDOM TOOTH EXTRACTION  1996    Family History  Problem Relation Age of Onset   Cancer Paternal Grandfather        skin,liver   Stroke Paternal Grandmother    Arthritis Maternal Grandmother    Other Maternal Grandfather        hemochromatosis   Other Father 37       Amyloidosis   Congestive Heart Failure Mother    Heart disease Mother    Hypertension Mother     Social History   Socioeconomic History   Marital status: Single    Spouse name: Not on file   Number of children: 2   Years of education: Bachelors   Highest education level: Not on file  Occupational History   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  Tobacco Use   Smoking status: Former    Types: Cigarettes   Smokeless tobacco: Never   Tobacco comments:    Quit 15 years ago  Vaping Use   Vaping status: Never Used  Substance and Sexual Activity   Alcohol use: Not Currently     Comment: on social event   Drug use: No    Comment: Quit 15 years ago   Sexual activity: Not Currently    Birth control/protection: None, Abstinence  Other Topics Concern   Not on file  Social History Narrative   Biology degree / chemistry from [employer]   Lives at home alone   Children live at home with [place] with their fathers, home with her on the weekends.   Right-handed   3-4 cups caffeine per day.   Social Determinants of Health   Financial Resource Strain: Low Risk  (10/30/2021)   Overall Financial Resource Strain (CARDIA)    Difficulty of Paying Living Expenses: Not hard at all  Food Insecurity: No Food Insecurity (10/30/2021)   Hunger Vital Sign    Worried About Running Out of Food in the Last Year: Never true    Ran Out of Food in the Last Year: Never true  Transportation Needs: No Transportation Needs (10/30/2021)   PRAPARE - Administrator, Civil Service (Medical): No    Lack of Transportation (Non-Medical): No  Physical Activity: Sufficiently Active (10/30/2021)   Exercise Vital Sign    Days of Exercise per Week: 3 days  Minutes of Exercise per Session: 50 min  Stress: No Stress Concern Present (10/30/2021)   [employer] of Occupational Health - Occupational Stress Questionnaire    Feeling of Stress : Only a little  Social Connections: Socially Isolated (10/30/2021)   Social Connection and Isolation Panel [NHANES]    Frequency of Communication with Friends and Family: Once a week    Frequency of Social Gatherings with Friends and Family: More than three times a week    Attends Religious Services: Never    Database administrator or Organizations: No    Attends Banker Meetings: Never    Marital Status: Never married  Intimate Partner Violence: Not At Risk (10/30/2021)   Humiliation, Afraid, Rape, and Kick questionnaire    Fear of Current or Ex-Partner: No    Emotionally Abused: No    Physically Abused: No    Sexually Abused: No     Outpatient Medications Prior to Visit  Medication Sig Dispense Refill   albuterol (VENTOLIN HFA) 108 (90 Base) MCG/ACT inhaler INHALE 1-2 PUFF INTO THE LUNGS EVERY 6 HOURS AS NEEDED FOR WHEEZING OR SHORTNESS OF BREATH 18 g 0   SUMAtriptan (IMITREX) 50 MG tablet Take 1 tablet (50 mg total) by mouth as needed for migraine. May repeat in 2 hours if headache persists or recurs. 20 tablet 0   No facility-administered medications prior to visit.    Allergies  Allergen Reactions   Topamax [Topiramate]     Gave increase headache    ROS Review of Systems    Objective:    Physical Exam HENT:     Head: Normocephalic.     Mouth/Throat:     Mouth: Mucous membranes are moist.  Cardiovascular:     Rate and Rhythm: Normal rate.     Heart sounds: Normal heart sounds.  Pulmonary:     Effort: Pulmonary effort is normal.     Breath sounds: Normal breath sounds.  Neurological:     Mental Status: She is alert.     BP 116/78   Pulse 77   Ht 5\' 5"  (1.651 m)   Wt 227 lb (103 kg)   SpO2 94%   BMI 37.77 kg/m  Wt Readings from Last 3 Encounters:  12/20/22 227 lb (103 kg)  09/17/22 233 lb 0.6 oz (105.7 kg)  05/24/22 234 lb 12.8 oz (106.5 kg)    Lab Results  Component Value Date   TSH 4.420 09/16/2022   Lab Results  Component Value Date   WBC 7.2 09/16/2022   HGB 13.5 09/16/2022   HCT 40.6 09/16/2022   MCV 93 09/16/2022   PLT 188 09/16/2022   Lab Results  Component Value Date   NA 142 09/16/2022   K 4.5 09/16/2022   CO2 20 09/16/2022   GLUCOSE 92 09/16/2022   BUN 19 09/16/2022   CREATININE 0.87 09/16/2022   BILITOT 0.2 09/16/2022   ALKPHOS 64 09/16/2022   AST 19 09/16/2022   ALT 22 09/16/2022   PROT 6.6 09/16/2022   ALBUMIN 4.2 09/16/2022   CALCIUM 9.3 09/16/2022   ANIONGAP 8 07/24/2015   EGFR 82 09/16/2022   Lab Results  Component Value Date   CHOL 224 (H) 09/16/2022   Lab Results  Component Value Date   HDL 66 09/16/2022   Lab Results  Component Value  Date   LDLCALC 139 (H) 09/16/2022   Lab Results  Component Value Date   TRIG 107 09/16/2022   Lab Results  Component Value Date  CHOLHDL 3.4 09/16/2022   Lab Results  Component Value Date   HGBA1C 5.8 (H) 09/16/2022      Assessment & Plan:  Migraine without aura and without status migrainosus, not intractable Assessment & Plan: The patient has reported a reduction in the frequency of migraine headaches, indicating improved stability. To continue managing the condition effectively, a refill of sumatriptan is recommended. Additionally, the patient is encouraged to increase physical activity and prioritize rest and relaxation, as stress is a known trigger for migraines. Implementing these lifestyle adjustments may contribute further to reducing the incidence of headaches  Orders: -     SUMAtriptan Succinate; Take 1 tablet (50 mg total) by mouth as needed for migraine. May repeat in 2 hours if headache persists or recurs.  Dispense: 20 tablet; Refill: 0  Left Achilles tendinitis Assessment & Plan: The patient is encouraged to call and follow up on the referral that has been placed. Additionally, she is currently seeing a chiropractor and reports noticeable improvements in her symptoms.   IFG (impaired fasting glucose) -     Hemoglobin A1c  Vitamin D deficiency -     VITAMIN D 25 Hydroxy (Vit-D Deficiency, Fractures)  Other specified hypothyroidism -     TSH + free T4  Other hyperlipidemia -     Lipid panel -     CMP14+EGFR -     CBC with Differential/Platelet  Note: This chart has been completed using Engineer, civil (consulting) software, and while attempts have been made to ensure accuracy, certain words and phrases may not be transcribed as intended.    Follow-up: Return in about 4 months (around 04/21/2023).   Gilmore Laroche, FNP

## 2022-12-20 NOTE — Patient Instructions (Signed)
I appreciate the opportunity to provide care to you today!    Follow up:  4 months  Fasting Labs: please stop by the lab during the week to get your blood drawn (CBC, CMP, TSH, Lipid profile, HgA1c, Vit D)    Please continue to a heart-healthy diet and increase your physical activities. Try to exercise for at least five days a week.    It was a pleasure to see you and I look forward to continuing to work together on your health and well-being. Please do not hesitate to call the office if you need care or have questions about your care.  In case of emergency, please visit the Emergency Department for urgent care, or contact our clinic at [phone] to schedule an appointment. We're here to help you!   Have a wonderful day and week. With Gratitude, Gilmore Laroche MSN, FNP-BC

## 2022-12-20 NOTE — Assessment & Plan Note (Signed)
The patient is encouraged to call and follow up on the referral that has been placed. Additionally, she is currently seeing a chiropractor and reports noticeable improvements in her symptoms.

## 2023-02-05 LAB — CMP14+EGFR
ALT: 17 [IU]/L (ref 0–32)
AST: 22 [IU]/L (ref 0–40)
Albumin: 4.5 g/dL (ref 3.9–4.9)
Alkaline Phosphatase: 64 [IU]/L (ref 44–121)
BUN/Creatinine Ratio: 13 (ref 9–23)
BUN: 11 mg/dL (ref 6–24)
Bilirubin Total: 0.4 mg/dL (ref 0.0–1.2)
CO2: 24 mmol/L (ref 20–29)
Calcium: 9 mg/dL (ref 8.7–10.2)
Chloride: 102 mmol/L (ref 96–106)
Creatinine, Ser: 0.88 mg/dL (ref 0.57–1.00)
Globulin, Total: 2.3 g/dL (ref 1.5–4.5)
Glucose: 89 mg/dL (ref 70–99)
Potassium: 4.2 mmol/L (ref 3.5–5.2)
Sodium: 141 mmol/L (ref 134–144)
Total Protein: 6.8 g/dL (ref 6.0–8.5)
eGFR: 81 mL/min/{1.73_m2} (ref >59)

## 2023-02-05 LAB — CBC WITH DIFFERENTIAL/PLATELET
Basophils Absolute: 0 10*3/uL (ref 0.0–0.2)
Basos: 0 %
EOS (ABSOLUTE): 0.1 10*3/uL (ref 0.0–0.4)
Eos: 1 %
Hematocrit: 45.3 % (ref 34.0–46.6)
Hemoglobin: 14.4 g/dL (ref 11.1–15.9)
Immature Grans (Abs): 0 10*3/uL (ref 0.0–0.1)
Immature Granulocytes: 0 %
Lymphocytes Absolute: 2.6 10*3/uL (ref 0.7–3.1)
Lymphs: 40 %
MCH: 30.4 pg (ref 26.6–33.0)
MCHC: 31.8 g/dL (ref 31.5–35.7)
MCV: 96 fL (ref 79–97)
Monocytes Absolute: 0.4 10*3/uL (ref 0.1–0.9)
Monocytes: 6 %
Neutrophils Absolute: 3.4 10*3/uL (ref 1.4–7.0)
Neutrophils: 53 %
Platelets: 216 10*3/uL (ref 150–450)
RBC: 4.74 x10E6/uL (ref 3.77–5.28)
RDW: 13 % (ref 11.7–15.4)
WBC: 6.5 10*3/uL (ref 3.4–10.8)

## 2023-02-05 LAB — LIPID PANEL
Chol/HDL Ratio: 3.5 {ratio} (ref 0.0–4.4)
Cholesterol, Total: 236 mg/dL — ABNORMAL HIGH (ref 100–199)
HDL: 68 mg/dL (ref >39)
LDL Chol Calc (NIH): 141 mg/dL — ABNORMAL HIGH (ref 0–99)
Triglycerides: 153 mg/dL — ABNORMAL HIGH (ref 0–149)
VLDL Cholesterol Cal: 27 mg/dL (ref 5–40)

## 2023-02-05 LAB — VITAMIN D 25 HYDROXY (VIT D DEFICIENCY, FRACTURES): Vit D, 25-Hydroxy: 44.2 ng/mL (ref 30.0–100.0)

## 2023-02-05 LAB — HEMOGLOBIN A1C
Est. average glucose Bld gHb Est-mCnc: 120 mg/dL
Hgb A1c MFr Bld: 5.8 % — ABNORMAL HIGH (ref 4.8–5.6)

## 2023-02-05 LAB — TSH+FREE T4
Free T4: 1.11 ng/dL (ref 0.82–1.77)
TSH: 3.54 u[IU]/mL (ref 0.450–4.500)

## 2023-02-09 NOTE — Progress Notes (Signed)
Please inform the patient that her labs indicate she is prediabetic. I recommend decreasing her intake of high-sugar foods and beverages while increasing physical activity. Additionally, her LDL cholesterol has increased, so I suggest reducing her intake of greasy, fatty, and starchy foods, along with increasing physical activity. All other labs are stable.

## 2023-02-22 ENCOUNTER — Other Ambulatory Visit (INDEPENDENT_AMBULATORY_CARE_PROVIDER_SITE_OTHER): Payer: 59

## 2023-02-22 DIAGNOSIS — R300 Dysuria: Secondary | ICD-10-CM

## 2023-02-22 LAB — POCT URINALYSIS DIPSTICK OB
Blood, UA: NEGATIVE
Glucose, UA: NEGATIVE
Ketones, UA: NEGATIVE
Leukocytes, UA: NEGATIVE
Nitrite, UA: NEGATIVE
POC,PROTEIN,UA: NEGATIVE

## 2023-02-22 NOTE — Progress Notes (Signed)
   NURSE VISIT- UTI SYMPTOMS   SUBJECTIVE:  Holly Yang is a 47 y.o. [phone] female here for UTI symptoms. She is a GYN patient. She reports dysuria that started about a week ago. Thinks her uterus may have dropped some as she can feel it and is also leaking urine at times.  OBJECTIVE:  There were no vitals taken for this visit.  Appears well, in no apparent distress  Results for orders placed or performed in visit on 02/22/23 (from the past 24 hour(s))  POC Urinalysis Dipstick OB   Collection Time: 02/22/23  3:12 PM  Result Value Ref Range   Color, UA     Clarity, UA     Glucose, UA Negative Negative   Bilirubin, UA     Ketones, UA neg    Spec Grav, UA     Blood, UA neg    pH, UA     POC,PROTEIN,UA Negative Negative, Trace, Small (1+), Moderate (2+), Large (3+), 4+   Urobilinogen, UA     Nitrite, UA neg    Leukocytes, UA Negative Negative   Appearance     Odor      ASSESSMENT: GYN patient with UTI symptoms and negative nitrites  PLAN: Note routed to Cyril Mourning, AGNP   Rx sent by provider today: No Urine culture sent Call or return to clinic prn if these symptoms worsen or fail to improve as anticipated. Follow-up: advised to make appt for pelvic exam to assess for possible prolapse  Jobe Marker  02/22/2023 3:18 PM

## 2023-02-23 ENCOUNTER — Ambulatory Visit: Payer: 59 | Admitting: Adult Health

## 2023-02-23 ENCOUNTER — Encounter: Payer: Self-pay | Admitting: Adult Health

## 2023-02-23 VITALS — BP 102/63 | HR 60 | Ht 65.0 in | Wt 230.5 lb

## 2023-02-23 DIAGNOSIS — R32 Unspecified urinary incontinence: Secondary | ICD-10-CM | POA: Diagnosis not present

## 2023-02-23 DIAGNOSIS — R350 Frequency of micturition: Secondary | ICD-10-CM | POA: Insufficient documentation

## 2023-02-23 LAB — URINALYSIS, ROUTINE W REFLEX MICROSCOPIC
Bilirubin, UA: NEGATIVE
Glucose, UA: NEGATIVE
Ketones, UA: NEGATIVE
Leukocytes,UA: NEGATIVE
Nitrite, UA: NEGATIVE
Protein,UA: NEGATIVE
RBC, UA: NEGATIVE
Specific Gravity, UA: 1.01 (ref 1.005–1.030)
Urobilinogen, Ur: 0.2 mg/dL (ref 0.2–1.0)
pH, UA: 6 (ref 5.0–7.5)

## 2023-02-23 NOTE — Progress Notes (Signed)
  Subjective:     Patient ID: Holly Yang, female   DOB: [birth year], 47 y.o.   MRN: [id number]  HPI [name] is a 47 year old white female,single, G2P2002, in complaining of random urine leakage, and has some frequency. She did urine with UA C&S  yesterday and culture is not back yet.     Component Value Date/Time   DIAGPAP  10/30/2021 1157    - Negative for intraepithelial lesion or malignancy (NILM)   DIAGPAP  02/16/2018 0000    NEGATIVE FOR INTRAEPITHELIAL LESIONS OR MALIGNANCY.   HPVHIGH Negative 10/30/2021 1157   ADEQPAP  10/30/2021 1157    Satisfactory for evaluation; transformation zone component PRESENT.   ADEQPAP  02/16/2018 0000    Satisfactory for evaluation  endocervical/transformation zone component PRESENT.   PCP is Gilmore Laroche NP.  Review of Systems Random urine leakage +urinary frequency Reviewed past medical,surgical, social and family history. Reviewed medications and allergies.     Objective:   Physical Exam BP 102/63 (BP Location: Left Arm, Patient Position: Sitting, Cuff Size: Large)   Pulse 60   Ht 5\' 5"  (1.651 m)   Wt 230 lb 8 oz (104.6 kg)   LMP 02/16/2023 (Approximate)   BMI 38.36 kg/m     Skin warm and dry.Pelvic: external genitalia is normal in appearance no lesions, vagina: pink,urethra has no lesions or masses noted, cervix:smooth and bulbous, uterus: normal size, shape and contour, non tender, no masses felt, adnexa: no masses or tenderness noted. Bladder is non tender and no masses felt. Assessment:     1. Urinary incontinence, unspecified type Has random leakage, maybe more with bending and jumping not really SUI or urge incontinence Keep event journal Try to lose about 25 lbs, eat lean and green Try to move 60 minutes every day  2. Urinary frequency Decrease caffeine  Will await final urine culture, if negative, could try vesicare, or get urology referral     Plan:     Follow up prn

## 2023-02-24 LAB — URINE CULTURE

## 2023-04-20 ENCOUNTER — Ambulatory Visit (INDEPENDENT_AMBULATORY_CARE_PROVIDER_SITE_OTHER): Payer: 59 | Admitting: Family Medicine

## 2023-04-20 ENCOUNTER — Encounter: Payer: Self-pay | Admitting: Family Medicine

## 2023-04-20 VITALS — BP 119/75 | HR 68 | Ht 65.0 in | Wt 233.1 lb

## 2023-04-20 DIAGNOSIS — Z23 Encounter for immunization: Secondary | ICD-10-CM

## 2023-04-20 DIAGNOSIS — G43009 Migraine without aura, not intractable, without status migrainosus: Secondary | ICD-10-CM

## 2023-04-20 NOTE — Progress Notes (Signed)
Established Patient Office Visit  Subjective:  Patient ID: Holly Yang, female    DOB: June 14, 1975  Age: 47 y.o. MRN: [id number]  CC:  Chief Complaint  Patient presents with   Care Management    Follow up states she never picked up the sumatriptan.    HPI Holly Yang is a 47 y.o. female presents for migraines follow-up. For the details of today's visit, please refer to the assessment and plan.    Past Medical History:  Diagnosis Date   Asthma in adult, mild intermittent, uncomplicated 08/30/2016   GERD without esophagitis 07/27/2016   High cholesterol    Irritable bowel disease    diarrhea   Migraines     Past Surgical History:  Procedure Laterality Date   CESAREAN SECTION  2007, 2010   [facility], [place], [place] & [place], [place], [place]   EXCISION OF SKIN TAG N/A 04/11/2013   Procedure: EXCISION OF EXTERNAL HEMORRHOIDAL SKIN TAGS;  Surgeon: Dalia Heading, MD;  Location: [facility];  Service: General;  Laterality: N/A;   WISDOM TOOTH EXTRACTION  1996    Family History  Problem Relation Age of Onset   Cancer Paternal Grandfather        skin,liver   Stroke Paternal Grandmother    Arthritis Maternal Grandmother    Other Maternal Grandfather        hemochromatosis   Other Father 39       Amyloidosis   Congestive Heart Failure Mother    Heart disease Mother    Hypertension Mother     Social History   Socioeconomic History   Marital status: Single    Spouse name: Not on file   Number of children: 2   Years of education: Bachelors   Highest education level: Bachelor's degree (e.g., BA, AB, BS)  Occupational History   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  Tobacco Use   Smoking status: Former    Types: Cigarettes   Smokeless tobacco: Never   Tobacco comments:    Quit 15 years ago  Vaping Use   Vaping status: Never Used  Substance and Sexual Activity   Alcohol use: Not Currently    Comment: on social event   Drug use:  No    Comment: Quit 15 years ago   Sexual activity: Not Currently    Birth control/protection: None, Abstinence  Other Topics Concern   Not on file  Social History Narrative   Biology degree / chemistry from [employer]   Lives at home alone   Children live at home with [place] with their fathers, home with her on the weekends.   Right-handed   3-4 cups caffeine per day.   Social Drivers of Corporate investment banker Strain: Low Risk  (04/16/2023)   Overall Financial Resource Strain (CARDIA)    Difficulty of Paying Living Expenses: Not hard at all  Food Insecurity: No Food Insecurity (04/16/2023)   Hunger Vital Sign    Worried About Running Out of Food in the Last Year: Never true    Ran Out of Food in the Last Year: Never true  Transportation Needs: No Transportation Needs (04/16/2023)   PRAPARE - Administrator, Civil Service (Medical): No    Lack of Transportation (Non-Medical): No  Physical Activity: Sufficiently Active (04/16/2023)   Exercise Vital Sign    Days of Exercise per Week: 3 days    Minutes of Exercise per Session: 90 min  Stress: No Stress  Concern Present (04/16/2023)   [employer] of Occupational Health - Occupational Stress Questionnaire    Feeling of Stress : Only a little  Social Connections: Moderately Isolated (04/16/2023)   Social Connection and Isolation Panel [NHANES]    Frequency of Communication with Friends and Family: Twice a week    Frequency of Social Gatherings with Friends and Family: More than three times a week    Attends Religious Services: Never    Database administrator or Organizations: Yes    Attends Engineer, structural: More than 4 times per year    Marital Status: Never married  Intimate Partner Violence: Not At Risk (10/30/2021)   Humiliation, Afraid, Rape, and Kick questionnaire    Fear of Current or Ex-Partner: No    Emotionally Abused: No    Physically Abused: No    Sexually Abused: No    Outpatient  Medications Prior to Visit  Medication Sig Dispense Refill   albuterol (VENTOLIN HFA) 108 (90 Base) MCG/ACT inhaler INHALE 1-2 PUFF INTO THE LUNGS EVERY 6 HOURS AS NEEDED FOR WHEEZING OR SHORTNESS OF BREATH 18 g 0   SUMAtriptan (IMITREX) 50 MG tablet Take 1 tablet (50 mg total) by mouth as needed for migraine. May repeat in 2 hours if headache persists or recurs. 20 tablet 0   No facility-administered medications prior to visit.    Allergies  Allergen Reactions   Topamax [Topiramate]     Gave increase headache    ROS Review of Systems  Constitutional:  Negative for chills and fever.  Eyes:  Negative for visual disturbance.  Respiratory:  Negative for chest tightness and shortness of breath.   Neurological:  Negative for dizziness and headaches.      Objective:    Physical Exam HENT:     Head: Normocephalic.     Mouth/Throat:     Mouth: Mucous membranes are moist.  Cardiovascular:     Rate and Rhythm: Normal rate.     Heart sounds: Normal heart sounds.  Pulmonary:     Effort: Pulmonary effort is normal.     Breath sounds: Normal breath sounds.  Neurological:     Mental Status: She is alert.     BP 119/75   Pulse 68   Ht 5\' 5"  (1.651 m)   Wt 233 lb 1.3 oz (105.7 kg)   SpO2 96%   BMI 38.79 kg/m  Wt Readings from Last 3 Encounters:  04/20/23 233 lb 1.3 oz (105.7 kg)  02/23/23 230 lb 8 oz (104.6 kg)  12/20/22 227 lb (103 kg)    Lab Results  Component Value Date   TSH 3.540 02/04/2023   Lab Results  Component Value Date   WBC 6.5 02/04/2023   HGB 14.4 02/04/2023   HCT 45.3 02/04/2023   MCV 96 02/04/2023   PLT 216 02/04/2023   Lab Results  Component Value Date   NA 141 02/04/2023   K 4.2 02/04/2023   CO2 24 02/04/2023   GLUCOSE 89 02/04/2023   BUN 11 02/04/2023   CREATININE 0.88 02/04/2023   BILITOT 0.4 02/04/2023   ALKPHOS 64 02/04/2023   AST 22 02/04/2023   ALT 17 02/04/2023   PROT 6.8 02/04/2023   ALBUMIN 4.5 02/04/2023   CALCIUM 9.0  02/04/2023   ANIONGAP 8 07/24/2015   EGFR 81 02/04/2023   Lab Results  Component Value Date   CHOL 236 (H) 02/04/2023   Lab Results  Component Value Date   HDL 68 02/04/2023  Lab Results  Component Value Date   LDLCALC 141 (H) 02/04/2023   Lab Results  Component Value Date   TRIG 153 (H) 02/04/2023   Lab Results  Component Value Date   CHOLHDL 3.5 02/04/2023   Lab Results  Component Value Date   HGBA1C 5.8 (H) 02/04/2023      Assessment & Plan:  Migraine without aura and without status migrainosus, not intractable Assessment & Plan: The patient reports feeling well overall but continues to experience frequent migraine headaches, noting 1-2 migraines monthly. She mentioned that the pharmacy never contacted her regarding her prescription. The patient was encouraged to pick up her prescription for sumatriptan and take it as needed, with a maximum of 200 mg daily for abortive treatment.  Nonpharmacological interventions were reviewed, including the application of cool compresses on the back of the neck and forehead, resting in a quiet, dark room, and staying well-hydrated. The patient verbalized understanding and is aware of the plan of care.   Encounter for immunization Assessment & Plan: Patient educated on CDC recommendation for the vaccine. Verbal consent was obtained from the patient, vaccine administered by nurse, no sign of adverse reactions noted at this time. Patient education on arm soreness and use of tylenol or ibuprofen for this patient  was discussed. Patient educated on the signs and symptoms of adverse effect and advise to contact the office if they occur.   Orders: -     Flu vaccine trivalent PF, 6mos and older(Flulaval,Afluria,Fluarix,Fluzone)  Note: This chart has been completed using Engineer, civil (consulting) software, and while attempts have been made to ensure accuracy, certain words and phrases may not be transcribed as intended.    Follow-up: Return  in about 3 months (around 07/19/2023).   Gilmore Laroche, FNP

## 2023-04-20 NOTE — Patient Instructions (Addendum)
I appreciate the opportunity to provide care to you today!    Follow up:  3 months  Non-Medication Approaches -Cold Compress or Ice Pack: Applying a cold compress to the forehead or back of the neck may help reduce pain or constrict blood vessels, potentially easing migraine pain. -Rest in a Dark, Quiet Room: Migraines often cause sensitivity to light and sound. Resting in a calm, dimly lit environment can help reduce the severity of symptoms. -Hydration: Dehydration is a common migraine trigger. Drinking water or electrolyte-balanced fluids can help if dehydration is contributing to the attack. -Caffeine: In small doses, caffeine may help relieve migraine pain and is often included in combination with medications (e.g., acetaminophen + caffeine). However, excessive caffeine consumption can be a trigger for some people.   When to Seek Emergency Care -If you experience the following, seek immediate medical help:A severe or unusual headache that is different from your usual migraines. Neurological symptoms such as weakness, numbness, difficulty speaking, or visual disturbances. A headache that is associated with a high fever, stiff neck, or rash (which could indicate an infection or other serious condition).     Please continue to a heart-healthy diet and increase your physical activities. Try to exercise for at least five days a week.    It was a pleasure to see you and I look forward to continuing to work together on your health and well-being. Please do not hesitate to call the office if you need care or have questions about your care.  In case of emergency, please visit the Emergency Department for urgent care, or contact our clinic at [phone] to schedule an appointment. We're here to help you!   Have a wonderful day and week. With Gratitude, Gilmore Laroche MSN, FNP-BC

## 2023-04-22 NOTE — Assessment & Plan Note (Signed)
The patient reports feeling well overall but continues to experience frequent migraine headaches, noting 1-2 migraines monthly. She mentioned that the pharmacy never contacted her regarding her prescription. The patient was encouraged to pick up her prescription for sumatriptan and take it as needed, with a maximum of 200 mg daily for abortive treatment.  Nonpharmacological interventions were reviewed, including the application of cool compresses on the back of the neck and forehead, resting in a quiet, dark room, and staying well-hydrated. The patient verbalized understanding and is aware of the plan of care.

## 2023-04-22 NOTE — Assessment & Plan Note (Signed)
Patient educated on CDC recommendation for the vaccine. Verbal consent was obtained from the patient, vaccine administered by nurse, no sign of adverse reactions noted at this time. Patient education on arm soreness and use of tylenol or ibuprofen for this patient  was discussed. Patient educated on the signs and symptoms of adverse effect and advise to contact the office if they occur.  

## 2023-06-16 ENCOUNTER — Other Ambulatory Visit: Payer: Self-pay | Admitting: Family Medicine

## 2023-06-16 DIAGNOSIS — G43009 Migraine without aura, not intractable, without status migrainosus: Secondary | ICD-10-CM

## 2023-06-16 MED ORDER — SUMATRIPTAN SUCCINATE 50 MG PO TABS: 50.0000 mg | ORAL_TABLET | ORAL | 0 refills | Status: DC | PRN

## 2023-06-16 NOTE — Telephone Encounter (Signed)
Copied from CRM [id number]. Topic: Clinical - Medication Refill >> Jun 16, 2023  3:42 PM Gery Pray wrote: Most Recent Primary Care Visit:  Provider: Gilmore Laroche  Department: [facility]  Visit Type: OFFICE VISIT  Date: 04/20/2023  Medication: SUMAtriptan (IMITREX) 50 MG tablet   Has the patient contacted their pharmacy? No (Agent: If no, request that the patient contact the pharmacy for the refill. If patient does not wish to contact the pharmacy document the reason why and proceed with request.) (Agent: If yes, when and what did the pharmacy advise?)  Is this the correct pharmacy for this prescription? Yes If no, delete pharmacy and type the correct one.  This is the patient's preferred pharmacy:  [id number] - [place], [place] - [address] [place] [address] Phone: [phone] Fax: [phone]   Has the prescription been filled recently? No  Is the patient out of the medication? Yes  Has the patient been seen for an appointment in the last year OR does the patient have an upcoming appointment? Yes  Can we respond through MyChart? No  Agent: Please be advised that Rx refills may take up to 3 business days. We ask that you follow-up with your pharmacy.

## 2023-07-26 ENCOUNTER — Ambulatory Visit: Payer: 59 | Admitting: Family Medicine

## 2023-08-29 ENCOUNTER — Encounter: Payer: Self-pay | Admitting: Family Medicine

## 2023-08-29 ENCOUNTER — Ambulatory Visit: Payer: Self-pay | Admitting: Family Medicine

## 2023-08-29 VITALS — BP 113/80 | HR 72 | Resp 16 | Ht 65.0 in | Wt 234.0 lb

## 2023-08-29 DIAGNOSIS — E559 Vitamin D deficiency, unspecified: Secondary | ICD-10-CM

## 2023-08-29 DIAGNOSIS — R7301 Impaired fasting glucose: Secondary | ICD-10-CM

## 2023-08-29 DIAGNOSIS — E038 Other specified hypothyroidism: Secondary | ICD-10-CM

## 2023-08-29 DIAGNOSIS — R52 Pain, unspecified: Secondary | ICD-10-CM

## 2023-08-29 DIAGNOSIS — Z23 Encounter for immunization: Secondary | ICD-10-CM | POA: Diagnosis not present

## 2023-08-29 DIAGNOSIS — E7849 Other hyperlipidemia: Secondary | ICD-10-CM

## 2023-08-29 DIAGNOSIS — G43009 Migraine without aura, not intractable, without status migrainosus: Secondary | ICD-10-CM | POA: Diagnosis not present

## 2023-08-29 MED ORDER — MELOXICAM 7.5 MG PO TABS: 7.5000 mg | ORAL_TABLET | ORAL | 0 refills | Status: DC | PRN

## 2023-08-29 MED ORDER — SUMATRIPTAN SUCCINATE 50 MG PO TABS: 50.0000 mg | ORAL_TABLET | ORAL | 0 refills | Status: DC | PRN

## 2023-08-29 NOTE — Progress Notes (Unsigned)
 Established Patient Office Visit  Subjective:  Patient ID: Holly Yang, female    DOB: Nov 19, 1975  Age: 48 y.o. MRN: [id number]  CC:  Chief Complaint  Patient presents with   Migraine    Follow up visit     HPI Holly Yang is a 48 y.o. female with past medical history of Migraine headache presents for f/u of  chronic medical conditions. For the details of today's visit, please refer to the assessment and plan.     Past Medical History:  Diagnosis Date   Asthma in adult, mild intermittent, uncomplicated 08/30/2016   GERD without esophagitis 07/27/2016   High cholesterol    Irritable bowel disease    diarrhea   Migraines     Past Surgical History:  Procedure Laterality Date   CESAREAN SECTION  2007, 2010   [facility], [place], [place]  [place], [place], [place]   EXCISION OF SKIN TAG N/A 04/11/2013   Procedure: EXCISION OF EXTERNAL HEMORRHOIDAL SKIN TAGS;  Surgeon: Beau Bound, MD;  Location: [facility];  Service: General;  Laterality: N/A;   WISDOM TOOTH EXTRACTION  1996    Family History  Problem Relation Age of Onset   Cancer Paternal Grandfather        skin,liver   Stroke Paternal Grandmother    Arthritis Maternal Grandmother    Other Maternal Grandfather        hemochromatosis   Other Father 46       Amyloidosis   Congestive Heart Failure Mother    Heart disease Mother    Hypertension Mother     Social History   Socioeconomic History   Marital status: Single    Spouse name: Not on file   Number of children: 2   Years of education: Bachelors   Highest education level: Bachelor's degree (e.g., BA, AB, BS)  Occupational History   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  Tobacco Use   Smoking status: Former    Types: Cigarettes   Smokeless tobacco: Never   Tobacco comments:    Quit 15 years ago  Vaping Use   Vaping status: Never Used  Substance and Sexual Activity   Alcohol use: Not Currently    Comment: on  social event   Drug use: No    Comment: Quit 15 years ago   Sexual activity: Not Currently    Birth control/protection: None, Abstinence  Other Topics Concern   Not on file  Social History Narrative   Biology degree / chemistry from [employer]   Lives at home alone   Children live at home with [place] with their fathers, home with her on the weekends.   Right-handed   3-4 cups caffeine per day.   Social Drivers of Corporate investment banker Strain: Low Risk  (04/16/2023)   Overall Financial Resource Strain (CARDIA)    Difficulty of Paying Living Expenses: Not hard at all  Food Insecurity: No Food Insecurity (04/16/2023)   Hunger Vital Sign    Worried About Running Out of Food in the Last Year: Never true    Ran Out of Food in the Last Year: Never true  Transportation Needs: No Transportation Needs (04/16/2023)   PRAPARE - Administrator, Civil Service (Medical): No    Lack of Transportation (Non-Medical): No  Physical Activity: Sufficiently Active (04/16/2023)   Exercise Vital Sign    Days of Exercise per Week: 3 days    Minutes of Exercise per Session:  90 min  Stress: No Stress Concern Present (04/16/2023)   [employer] of Occupational Health - Occupational Stress Questionnaire    Feeling of Stress : Only a little  Social Connections: Moderately Isolated (04/16/2023)   Social Connection and Isolation Panel [NHANES]    Frequency of Communication with Friends and Family: Twice a week    Frequency of Social Gatherings with Friends and Family: More than three times a week    Attends Religious Services: Never    Database administrator or Organizations: Yes    Attends Engineer, structural: More than 4 times per year    Marital Status: Never married  Intimate Partner Violence: Not At Risk (10/30/2021)   Humiliation, Afraid, Rape, and Kick questionnaire    Fear of Current or Ex-Partner: No    Emotionally Abused: No    Physically Abused: No    Sexually  Abused: No    Outpatient Medications Prior to Visit  Medication Sig Dispense Refill   albuterol  (VENTOLIN  HFA) 108 (90 Base) MCG/ACT inhaler INHALE 1-2 PUFF INTO THE LUNGS EVERY 6 HOURS AS NEEDED FOR WHEEZING OR SHORTNESS OF BREATH 18 g 0   SUMAtriptan  (IMITREX ) 50 MG tablet Take 1 tablet (50 mg total) by mouth as needed for migraine. May repeat in 2 hours if headache persists or recurs. 20 tablet 0   No facility-administered medications prior to visit.    Allergies  Allergen Reactions   Topamax  [Topiramate ]     Gave increase headache    ROS Review of Systems  Constitutional:  Negative for chills and fever.  Eyes:  Negative for visual disturbance.  Respiratory:  Negative for chest tightness and shortness of breath.   Neurological:  Negative for dizziness and headaches.      Objective:    Physical Exam HENT:     Head: Normocephalic.     Mouth/Throat:     Mouth: Mucous membranes are moist.  Cardiovascular:     Rate and Rhythm: Normal rate.     Heart sounds: Normal heart sounds.  Pulmonary:     Effort: Pulmonary effort is normal.     Breath sounds: Normal breath sounds.  Neurological:     Mental Status: She is alert.     BP 113/80   Pulse 72   Resp 16   Ht 5\' 5"  (1.651 m)   Wt 234 lb (106.1 kg)   SpO2 96%   BMI 38.94 kg/m  Wt Readings from Last 3 Encounters:  08/29/23 234 lb (106.1 kg)  04/20/23 233 lb 1.3 oz (105.7 kg)  02/23/23 230 lb 8 oz (104.6 kg)    Lab Results  Component Value Date   TSH 3.540 02/04/2023   Lab Results  Component Value Date   WBC 6.5 02/04/2023   HGB 14.4 02/04/2023   HCT 45.3 02/04/2023   MCV 96 02/04/2023   PLT 216 02/04/2023   Lab Results  Component Value Date   NA 141 02/04/2023   K 4.2 02/04/2023   CO2 24 02/04/2023   GLUCOSE 89 02/04/2023   BUN 11 02/04/2023   CREATININE 0.88 02/04/2023   BILITOT 0.4 02/04/2023   ALKPHOS 64 02/04/2023   AST 22 02/04/2023   ALT 17 02/04/2023   PROT 6.8 02/04/2023   ALBUMIN 4.5  02/04/2023   CALCIUM 9.0 02/04/2023   ANIONGAP 8 07/24/2015   EGFR 81 02/04/2023   Lab Results  Component Value Date   CHOL 236 (H) 02/04/2023   Lab Results  Component Value  Date   HDL 68 02/04/2023   Lab Results  Component Value Date   LDLCALC 141 (H) 02/04/2023   Lab Results  Component Value Date   TRIG 153 (H) 02/04/2023   Lab Results  Component Value Date   CHOLHDL 3.5 02/04/2023   Lab Results  Component Value Date   HGBA1C 5.8 (H) 02/04/2023      Assessment & Plan:  Migraine without aura and without status migrainosus, not intractable Assessment & Plan: Stable  Encouraged to continue current regimen as is Refills sent to the pharmacy  Orders: -     SUMAtriptan  Succinate; Take 1 tablet (50 mg total) by mouth as needed for migraine. May repeat in 2 hours if headache persists or recurs.  Dispense: 20 tablet; Refill: 0  Encounter for immunization Assessment & Plan: Patient educated on CDC recommendation for the vaccine. Verbal consent was obtained from the patient, vaccine administered by nurse, no sign of adverse reactions noted at this time. Patient education on arm soreness and use of tylenol  or ibuprofen for this patient  was discussed. Patient educated on the signs and symptoms of adverse effect and advise to contact the office if they occur.   Orders: -     Pneumococcal conjugate vaccine 20-valent  Pain Assessment & Plan: The patient complains of body aches and pain after working out and is requesting a prescription for meloxicam. A prescription for meloxicam will be sent to the pharmacy to be taken on an as-needed basis.  Discussed potential side effects and adverse effects of the medication, including gastrointestinal upset, increased risk of bleeding, and kidney function impact with prolonged use.  Patient verbalized understanding.       Orders: -     Meloxicam; Take 1 tablet (7.5 mg total) by mouth as needed for pain.  Dispense: 30 tablet;  Refill: 0  IFG (impaired fasting glucose) -     Hemoglobin A1c  Vitamin D  deficiency -     VITAMIN D  25 Hydroxy (Vit-D Deficiency, Fractures)  TSH (thyroid -stimulating hormone deficiency) -     TSH + free T4  Other hyperlipidemia -     Lipid panel -     CMP14+EGFR -     CBC with Differential/Platelet  Note: This chart has been completed using Engineer, civil (consulting) software, and while attempts have been made to ensure accuracy, certain words and phrases may not be transcribed as intended.    Follow-up: Return in about 4 months (around 12/29/2023).   Daemon Dowty, FNP

## 2023-08-29 NOTE — Patient Instructions (Addendum)
I appreciate the opportunity to provide care to you today!    Follow up:  4 months  Labs: please stop by the lab during the week to get your blood drawn (CBC, CMP, TSH, Lipid profile, HgA1c, Vit D)   Here are some foods to avoid or reduce in your diet to help manage cholesterol levels:  Fried Foods:Deep-fried items such as french fries, fried chicken, and fried snacks are high in unhealthy fats and can raise LDL (bad) cholesterol levels. Processed Meats:Foods like bacon, sausage, hot dogs, and deli meats are often high in saturated fat and cholesterol. Full-Fat Dairy Products:Whole milk, full-fat yogurt, butter, cream, and cheese are rich in saturated fats, which can increase cholesterol levels. Baked Goods and Sweets:Pastries, cakes, cookies, and donuts often contain trans fats and added sugars, which can raise LDL cholesterol and lower HDL (good) cholesterol. Red Meat:Beef, lamb, and pork are high in saturated fat. Lean cuts or plant-based protein alternatives are better options. Lard and Shortening:Used in some baked goods, lard and shortening are high in trans fats and should be avoided. Fast Food:Many fast food items are cooked with unhealthy oils and contain high amounts of saturated and trans fats. Processed Snacks:Chips, crackers, and certain microwave popcorns can contain trans fats and high levels of unhealthy oils. Shellfish:While nutritious in other ways, some shellfish like shrimp, lobster, and crab are high in cholesterol. They should be consumed in moderation. Coconut and Palm Oils:these oils are high in saturated fat and can raise cholesterol levels when used in cooking or baking.      Please continue to a heart-healthy diet and increase your physical activities. Try to exercise for at least five days a week.    It was a pleasure to see you and I look forward to continuing to work together on your health and well-being. Please do not hesitate to call the office if you  need care or have questions about your care.  In case of emergency, please visit the Emergency Department for urgent care, or contact our clinic at [phone] to schedule an appointment. We're here to help you!   Have a wonderful day and week. With Gratitude, Gilmore Laroche MSN, FNP-BC

## 2023-08-30 ENCOUNTER — Other Ambulatory Visit (HOSPITAL_COMMUNITY): Payer: Self-pay | Admitting: Family Medicine

## 2023-08-30 DIAGNOSIS — R52 Pain, unspecified: Secondary | ICD-10-CM | POA: Insufficient documentation

## 2023-08-30 DIAGNOSIS — Z1231 Encounter for screening mammogram for malignant neoplasm of breast: Secondary | ICD-10-CM

## 2023-08-30 NOTE — Assessment & Plan Note (Signed)
 The patient complains of body aches and pain after working out and is requesting a prescription for meloxicam. A prescription for meloxicam will be sent to the pharmacy to be taken on an as-needed basis.  Discussed potential side effects and adverse effects of the medication, including gastrointestinal upset, increased risk of bleeding, and kidney function impact with prolonged use.  Patient verbalized understanding.

## 2023-08-30 NOTE — Assessment & Plan Note (Signed)
 Stable  Encouraged to continue current regimen as is Refills sent to the pharmacy

## 2023-08-30 NOTE — Assessment & Plan Note (Signed)
 Patient educated on CDC recommendation for the vaccine. Verbal consent was obtained from the patient, vaccine administered by nurse, no sign of adverse reactions noted at this time. Patient education on arm soreness and use of tylenol or ibuprofen for this patient  was discussed. Patient educated on the signs and symptoms of adverse effect and advise to contact the office if they occur.

## 2023-08-31 ENCOUNTER — Ambulatory Visit: Admitting: Orthopedic Surgery

## 2023-08-31 LAB — TSH+FREE T4
Free T4: 1.22 ng/dL (ref 0.82–1.77)
TSH: 3.93 u[IU]/mL (ref 0.450–4.500)

## 2023-08-31 LAB — CMP14+EGFR
ALT: 23 IU/L (ref 0–32)
AST: 26 IU/L (ref 0–40)
Albumin: 4.5 g/dL (ref 3.9–4.9)
Alkaline Phosphatase: 66 IU/L (ref 44–121)
BUN/Creatinine Ratio: 19 (ref 9–23)
BUN: 16 mg/dL (ref 6–24)
Bilirubin Total: 0.4 mg/dL (ref 0.0–1.2)
CO2: 23 mmol/L (ref 20–29)
Calcium: 9.2 mg/dL (ref 8.7–10.2)
Chloride: 100 mmol/L (ref 96–106)
Creatinine, Ser: 0.83 mg/dL (ref 0.57–1.00)
Globulin, Total: 2.3 g/dL (ref 1.5–4.5)
Glucose: 96 mg/dL (ref 70–99)
Potassium: 4.6 mmol/L (ref 3.5–5.2)
Sodium: 139 mmol/L (ref 134–144)
Total Protein: 6.8 g/dL (ref 6.0–8.5)
eGFR: 87 mL/min/{1.73_m2} (ref >59)

## 2023-08-31 LAB — LIPID PANEL
Chol/HDL Ratio: 3.2 ratio (ref 0.0–4.4)
Cholesterol, Total: 221 mg/dL — ABNORMAL HIGH (ref 100–199)
HDL: 70 mg/dL (ref >39)
LDL Chol Calc (NIH): 129 mg/dL — ABNORMAL HIGH (ref 0–99)
Triglycerides: 123 mg/dL (ref 0–149)
VLDL Cholesterol Cal: 22 mg/dL (ref 5–40)

## 2023-08-31 LAB — CBC WITH DIFFERENTIAL/PLATELET
Basophils Absolute: 0 10*3/uL (ref 0.0–0.2)
Basos: 0 %
EOS (ABSOLUTE): 0.2 10*3/uL (ref 0.0–0.4)
Eos: 2 %
Hematocrit: 41.9 % (ref 34.0–46.6)
Hemoglobin: 13.7 g/dL (ref 11.1–15.9)
Immature Grans (Abs): 0 10*3/uL (ref 0.0–0.1)
Immature Granulocytes: 0 %
Lymphocytes Absolute: 2.7 10*3/uL (ref 0.7–3.1)
Lymphs: 30 %
MCH: 31 pg (ref 26.6–33.0)
MCHC: 32.7 g/dL (ref 31.5–35.7)
MCV: 95 fL (ref 79–97)
Monocytes Absolute: 0.7 10*3/uL (ref 0.1–0.9)
Monocytes: 7 %
Neutrophils Absolute: 5.3 10*3/uL (ref 1.4–7.0)
Neutrophils: 61 %
Platelets: 192 10*3/uL (ref 150–450)
RBC: 4.42 x10E6/uL (ref 3.77–5.28)
RDW: 13.1 % (ref 11.7–15.4)
WBC: 8.8 10*3/uL (ref 3.4–10.8)

## 2023-08-31 LAB — HEMOGLOBIN A1C
Est. average glucose Bld gHb Est-mCnc: 120 mg/dL
Hgb A1c MFr Bld: 5.8 % — ABNORMAL HIGH (ref 4.8–5.6)

## 2023-08-31 LAB — VITAMIN D 25 HYDROXY (VIT D DEFICIENCY, FRACTURES): Vit D, 25-Hydroxy: 52.5 ng/mL (ref 30.0–100.0)

## 2023-08-31 NOTE — Progress Notes (Signed)
 Please inform the patient that her hemoglobin A1c indicates she is prediabetic, with a value of 5.8. I recommend lifestyle changes by decreasing her intake of high-sugar beverages and increasing physical activity. Her cholesterol levels have decreased - good effort - and I recommend she continue implementing lifestyle changes by reducing her intake of greasy, fatty, and starchy foods, along with increased physical activity. All other labs are stable.

## 2023-09-02 NOTE — Progress Notes (Signed)
 The first-line treatment for prediabetes is lifestyle modification, including healthy diet and regular physical activity. However, if she is interested in starting medication, please let me know, and a prescription for metformin can be sent to her pharmacy.

## 2023-09-05 ENCOUNTER — Ambulatory Visit (HOSPITAL_COMMUNITY)
Admission: RE | Admit: 2023-09-05 | Discharge: 2023-09-05 | Disposition: A | Discharge status: Home / Self Care | Source: Ambulatory Visit | Attending: Family Medicine | Admitting: Family Medicine

## 2023-09-05 DIAGNOSIS — Z1231 Encounter for screening mammogram for malignant neoplasm of breast: Secondary | ICD-10-CM | POA: Insufficient documentation

## 2023-09-06 ENCOUNTER — Ambulatory Visit: Admitting: Adult Health

## 2023-09-06 ENCOUNTER — Encounter: Payer: Self-pay | Admitting: Adult Health

## 2023-09-06 VITALS — BP 137/68 | HR 71 | Ht 65.0 in | Wt 236.5 lb

## 2023-09-06 DIAGNOSIS — G479 Sleep disorder, unspecified: Secondary | ICD-10-CM | POA: Diagnosis not present

## 2023-09-06 DIAGNOSIS — R232 Flushing: Secondary | ICD-10-CM | POA: Diagnosis not present

## 2023-09-06 DIAGNOSIS — R5383 Other fatigue: Secondary | ICD-10-CM | POA: Diagnosis not present

## 2023-09-06 DIAGNOSIS — N926 Irregular menstruation, unspecified: Secondary | ICD-10-CM | POA: Insufficient documentation

## 2023-09-06 DIAGNOSIS — R61 Generalized hyperhidrosis: Secondary | ICD-10-CM | POA: Diagnosis not present

## 2023-09-06 NOTE — Progress Notes (Addendum)
  Subjective:     Patient ID: Holly Yang, female   DOB: January 11, 1976, 48 y.o.   MRN: [id number]  HPI Alizzon is a 48 year old white female,single, G2P2002, in wanting to discuss HRT, having hot flashes, night sweats, tired, trouble sleeping and can't lose weight. She is working out 5-6 days a week.And skipping periods.    Component Value Date/Time   DIAGPAP  10/30/2021 1157    - Negative for intraepithelial lesion or malignancy (NILM)   DIAGPAP  02/16/2018 0000    NEGATIVE FOR INTRAEPITHELIAL LESIONS OR MALIGNANCY.   HPVHIGH Negative 10/30/2021 1157   ADEQPAP  10/30/2021 1157    Satisfactory for evaluation; transformation zone component PRESENT.   ADEQPAP  02/16/2018 0000    Satisfactory for evaluation  endocervical/transformation zone component PRESENT.    PCP is Gloria Zarwolo NP  Review of Systems +hot flashes,  +night sweats,  +tired,  +trouble sleeping   can't lose weight. She is working out 5-6 days a week. Skipping periods  Denies MI,stroke, DVT,breast cancer or migraine with aura  Has frequent BM had seen GI in past but no colonoscopy yet    Reviewed past medical,surgical, social and family history. Reviewed medications and allergies.  Objective:   Physical Exam BP 137/68 (BP Location: Right Arm, Patient Position: Sitting, Cuff Size: Large)   Pulse 71   Ht 5\' 5"  (1.651 m)   Wt 236 lb 8 oz (107.3 kg)   LMP 05/09/2023 (Approximate)   BMI 39.36 kg/m     Skin warm and dry. Lungs: clear to ausculation bilaterally. Cardiovascular: regular rate and rhythm.   Upstream - 09/06/23 1533       Pregnancy Intention Screening   Does the patient want to become pregnant in the next year? No    Does the patient's partner want to become pregnant in the next year? No    Would the patient like to discuss contraceptive options today? No      Contraception Wrap Up   Current Method Abstinence    End Method Abstinence    Contraception Counseling Provided No              Assessment:     1. Hot flashes (Primary) - FSH/LH - Estradiol - Progesterone Discussed HRT as option  Review handouts on HRT and menopause  2. Night sweats - FSH/LH - Estradiol - Progesterone  3. Tired CBC was normal 08/30/23 as was TSH   4. Sleep disturbance   5. Irregular periods Skipping periods Will check labs  - FSH/LH - Estradiol - Progesterone     Plan:     Will talk when labs back    Follow up prn

## 2023-09-07 LAB — PROGESTERONE: Progesterone: 0.1 ng/mL

## 2023-09-07 LAB — FSH/LH
FSH: 21 m[IU]/mL
LH: 33.2 m[IU]/mL

## 2023-09-07 LAB — ESTRADIOL: Estradiol: 65.7 pg/mL

## 2023-09-08 ENCOUNTER — Encounter: Payer: Self-pay | Admitting: Family Medicine

## 2023-09-08 ENCOUNTER — Other Ambulatory Visit: Payer: Self-pay | Admitting: Adult Health

## 2023-09-08 MED ORDER — PROGESTERONE 200 MG PO CAPS: 200.0000 mg | ORAL_CAPSULE | Freq: Every day | ORAL | 1 refills | Status: DC

## 2023-09-08 NOTE — Progress Notes (Signed)
Rx  Prometrium

## 2023-09-15 ENCOUNTER — Other Ambulatory Visit: Payer: Self-pay

## 2023-09-15 DIAGNOSIS — Z136 Encounter for screening for cardiovascular disorders: Secondary | ICD-10-CM

## 2023-09-27 ENCOUNTER — Ambulatory Visit (HOSPITAL_COMMUNITY)
Admission: RE | Admit: 2023-09-27 | Discharge: 2023-09-27 | Disposition: A | Discharge status: Home / Self Care | Payer: Self-pay | Source: Ambulatory Visit

## 2023-09-27 DIAGNOSIS — Z136 Encounter for screening for cardiovascular disorders: Secondary | ICD-10-CM | POA: Insufficient documentation

## 2023-10-10 ENCOUNTER — Ambulatory Visit: Payer: Self-pay

## 2023-10-17 ENCOUNTER — Other Ambulatory Visit: Payer: Self-pay | Admitting: Family Medicine

## 2023-10-17 DIAGNOSIS — R52 Pain, unspecified: Secondary | ICD-10-CM

## 2023-11-15 ENCOUNTER — Other Ambulatory Visit: Payer: Self-pay | Admitting: Adult Health

## 2023-11-15 ENCOUNTER — Other Ambulatory Visit: Payer: Self-pay | Admitting: Family Medicine

## 2023-11-15 DIAGNOSIS — R52 Pain, unspecified: Secondary | ICD-10-CM

## 2023-12-14 ENCOUNTER — Other Ambulatory Visit: Payer: Self-pay

## 2023-12-14 DIAGNOSIS — R52 Pain, unspecified: Secondary | ICD-10-CM

## 2024-01-05 ENCOUNTER — Encounter: Payer: Self-pay | Admitting: Family Medicine

## 2024-01-05 ENCOUNTER — Ambulatory Visit: Admitting: Family Medicine

## 2024-01-05 DIAGNOSIS — E559 Vitamin D deficiency, unspecified: Secondary | ICD-10-CM | POA: Diagnosis not present

## 2024-01-05 DIAGNOSIS — J4520 Mild intermittent asthma, uncomplicated: Secondary | ICD-10-CM | POA: Diagnosis not present

## 2024-01-05 DIAGNOSIS — R7301 Impaired fasting glucose: Secondary | ICD-10-CM

## 2024-01-05 DIAGNOSIS — E038 Other specified hypothyroidism: Secondary | ICD-10-CM

## 2024-01-05 DIAGNOSIS — E7849 Other hyperlipidemia: Secondary | ICD-10-CM

## 2024-01-05 DIAGNOSIS — Z23 Encounter for immunization: Secondary | ICD-10-CM | POA: Diagnosis not present

## 2024-01-05 DIAGNOSIS — T7840XA Allergy, unspecified, initial encounter: Secondary | ICD-10-CM | POA: Insufficient documentation

## 2024-01-05 DIAGNOSIS — T7840XD Allergy, unspecified, subsequent encounter: Secondary | ICD-10-CM | POA: Diagnosis not present

## 2024-01-05 MED ORDER — ALBUTEROL SULFATE HFA 108 (90 BASE) MCG/ACT IN AERS: INHALATION_SPRAY | RESPIRATORY_TRACT | 0 refills | Status: AC

## 2024-01-05 NOTE — Assessment & Plan Note (Signed)
 Reassure patient that current chest pain is likely allergy-related given history and lack of acute concerning symptoms, but continue to monitor. Recommend taking otc antihistamine or allergy medications as previously prescribed. Encourage hydration, use of saline nasal spray, and avoidance of known allergens. Educate patient on warning signs (e.g., worsening chest pain, shortness of breath, fever, palpitations) and advise seeking emergency care if these develop. Will consider chest X-ray and/or EKG if pain persists or worsens to rule out other causes.

## 2024-01-05 NOTE — Assessment & Plan Note (Signed)
 Patient educated on CDC recommendation for the vaccine. Verbal consent was obtained from the patient, vaccine administered by nurse, no sign of adverse reactions noted at this time. Patient education on arm soreness and use of tylenol or ibuprofen for this patient  was discussed. Patient educated on the signs and symptoms of adverse effect and advise to contact the office if they occur.

## 2024-01-05 NOTE — Patient Instructions (Addendum)
 I appreciate the opportunity to provide care to you today!    Follow up:  5 months  Labs: please stop by the lab during the week to get your blood drawn (CBC, CMP, TSH, Lipid profile, HgA1c, Vit D)  For a Healthier YOU, I Recommend: Reducing your intake of sugar, sodium, carbohydrates, and saturated fats. Increasing your fiber intake by incorporating more whole grains, fruits, and vegetables into your meals. Setting healthy goals with a focus on lowering your consumption of carbs, sugar, and unhealthy fats. Adding variety to your diet by including a wide range of fruits and vegetables. Cutting back on soda and limiting processed foods as much as possible. Staying active: In addition to taking your weight loss medication, aim for at least 150 minutes of moderate-intensity physical activity each week for optimal results.    Please follow up if your symptoms worsen or fail to improve.   Please continue to a heart-healthy diet and increase your physical activities. Try to exercise for at least five days a week.    It was a pleasure to see you and I look forward to continuing to work together on your health and well-being. Please do not hesitate to call the office if you need care or have questions about your care.  In case of emergency, please visit the Emergency Department for urgent care, or contact our clinic at [phone] to schedule an appointment. We're here to help you!   Have a wonderful day and week. With Gratitude, Litsy Epting MSN, FNP-BC

## 2024-01-05 NOTE — Progress Notes (Addendum)
 Established Patient Office Visit  Subjective:  Patient ID: Holly Yang, female    DOB: 03/31/1976  Age: 48 y.o. MRN: [id number]  CC:  Chief Complaint  Patient presents with   Follow-up    Migraines, Would like to have a colonoscopy    Anxiety    Chest pain, worrying     HPI Holly Yang is a 48 y.o. female with past medical history of migraines presents for f/u of  chronic medical conditions.   Chest pain: The patient reports ongoing chest pain for the past 48 hours. She attributes the pain to her allergies and notes pulmonary symptoms consistent with her allergies.She has not taken any medications. The patient expresses concern and paranoia that she might develop bronchitis like her friend. She reports the pain has been constant.  Past Medical History:  Diagnosis Date   Allergies 01/05/2024   Asthma in adult, mild intermittent, uncomplicated 08/30/2016   GERD without esophagitis 07/27/2016   High cholesterol    Irritable bowel disease    diarrhea   Migraines     Past Surgical History:  Procedure Laterality Date   CESAREAN SECTION  2007, 2010   [place], [place], [place] & [place]  [place], [place], [place]   EXCISION OF SKIN TAG N/A 04/11/2013   Procedure: EXCISION OF EXTERNAL HEMORRHOIDAL SKIN TAGS;  Surgeon: Oneil DELENA Budge, MD;  Location: [facility];  Service: General;  Laterality: N/A;   WISDOM TOOTH EXTRACTION  1996    Family History  Problem Relation Age of Onset   Cancer Paternal Grandfather        skin,liver   Stroke Paternal Grandmother    Arthritis Maternal Grandmother    Other Maternal Grandfather        hemochromatosis   Other Father 54       Amyloidosis   Congestive Heart Failure Mother    Heart disease Mother    Hypertension Mother     Social History   Socioeconomic History   Marital status: Single    Spouse name: Not on file   Number of children: 2   Years of education: Bachelors   Highest education level: Bachelor's degree  (e.g., BA, AB, BS)  Occupational History   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  Tobacco Use   Smoking status: Former    Types: Cigarettes   Smokeless tobacco: Never   Tobacco comments:    Quit 15 years ago  Vaping Use   Vaping status: Never Used  Substance and Sexual Activity   Alcohol use: Not Currently    Comment: on social event   Drug use: No    Comment: Quit 15 years ago   Sexual activity: Not Currently    Birth control/protection: None, Abstinence  Other Topics Concern   Not on file  Social History Narrative   Biology degree / chemistry from [employer]   Lives at home alone   Children live at home with [place] with their fathers, home with her on the weekends.   Right-handed   3-4 cups caffeine per day.   Social Drivers of Corporate investment banker Strain: Low Risk  (04/16/2023)   Overall Financial Resource Strain (CARDIA)    Difficulty of Paying Living Expenses: Not hard at all  Food Insecurity: No Food Insecurity (04/16/2023)   Hunger Vital Sign    Worried About Running Out of Food in the Last Year: Never true    Ran Out of Food in the Last Year: Never true  Transportation Needs: No Transportation Needs (04/16/2023)   PRAPARE - Administrator, Civil Service (Medical): No    Lack of Transportation (Non-Medical): No  Physical Activity: Sufficiently Active (04/16/2023)   Exercise Vital Sign    Days of Exercise per Week: 3 days    Minutes of Exercise per Session: 90 min  Stress: No Stress Concern Present (04/16/2023)   [employer] of Occupational Health - Occupational Stress Questionnaire    Feeling of Stress : Only a little  Social Connections: Moderately Isolated (04/16/2023)   Social Connection and Isolation Panel    Frequency of Communication with Friends and Family: Twice a week    Frequency of Social Gatherings with Friends and Family: More than three times a week    Attends Religious Services: Never    Database administrator  or Organizations: Yes    Attends Engineer, structural: More than 4 times per year    Marital Status: Never married  Intimate Partner Violence: Not At Risk (10/30/2021)   Humiliation, Afraid, Rape, and Kick questionnaire    Fear of Current or Ex-Partner: No    Emotionally Abused: No    Physically Abused: No    Sexually Abused: No    Outpatient Medications Prior to Visit  Medication Sig Dispense Refill   meloxicam  (MOBIC ) 7.5 MG tablet TAKE 1 TABLET BY MOUTH EVERY DAY AS NEEDED FOR PAIN 30 tablet 0   progesterone  (PROMETRIUM ) 200 MG capsule TAKE 1 CAPSULE(200 MG) BY MOUTH DAILY AT BEDTIME 30 capsule 1   SUMAtriptan  (IMITREX ) 50 MG tablet Take 1 tablet (50 mg total) by mouth as needed for migraine. May repeat in 2 hours if headache persists or recurs. 20 tablet 0   albuterol  (VENTOLIN  HFA) 108 (90 Base) MCG/ACT inhaler INHALE 1-2 PUFF INTO THE LUNGS EVERY 6 HOURS AS NEEDED FOR WHEEZING OR SHORTNESS OF BREATH 18 g 0   No facility-administered medications prior to visit.    Allergies  Allergen Reactions   Topamax  [Topiramate ]     Gave increase headache    ROS Review of Systems  Constitutional:  Negative for chills and fever.  Eyes:  Negative for visual disturbance.  Respiratory:  Negative for chest tightness and shortness of breath.   Cardiovascular:  Positive for chest pain.  Neurological:  Negative for dizziness and headaches.      Objective:    Physical Exam HENT:     Head: Normocephalic.     Mouth/Throat:     Mouth: Mucous membranes are moist.  Cardiovascular:     Rate and Rhythm: Normal rate.     Heart sounds: Normal heart sounds.  Pulmonary:     Effort: Pulmonary effort is normal.     Breath sounds: Normal breath sounds.  Neurological:     Mental Status: She is alert.     There were no vitals taken for this visit. Wt Readings from Last 3 Encounters:  09/06/23 236 lb 8 oz (107.3 kg)  08/29/23 234 lb (106.1 kg)  04/20/23 233 lb 1.3 oz (105.7 kg)     Lab Results  Component Value Date   TSH 3.930 08/30/2023   Lab Results  Component Value Date   WBC 8.8 08/30/2023   HGB 13.7 08/30/2023   HCT 41.9 08/30/2023   MCV 95 08/30/2023   PLT 192 08/30/2023   Lab Results  Component Value Date   NA 139 08/30/2023   K 4.6 08/30/2023   CO2 23 08/30/2023   GLUCOSE 96 08/30/2023  BUN 16 08/30/2023   CREATININE 0.83 08/30/2023   BILITOT 0.4 08/30/2023   ALKPHOS 66 08/30/2023   AST 26 08/30/2023   ALT 23 08/30/2023   PROT 6.8 08/30/2023   ALBUMIN 4.5 08/30/2023   CALCIUM 9.2 08/30/2023   ANIONGAP 8 07/24/2015   EGFR 87 08/30/2023   Lab Results  Component Value Date   CHOL 221 (H) 08/30/2023   Lab Results  Component Value Date   HDL 70 08/30/2023   Lab Results  Component Value Date   LDLCALC 129 (H) 08/30/2023   Lab Results  Component Value Date   TRIG 123 08/30/2023   Lab Results  Component Value Date   CHOLHDL 3.2 08/30/2023   Lab Results  Component Value Date   HGBA1C 5.8 (H) 08/30/2023      Assessment & Plan:  Allergy, subsequent encounter Assessment & Plan: Reassure patient that current chest pain is likely allergy-related given history and lack of acute concerning symptoms, but continue to monitor. Recommend taking otc antihistamine or allergy medications as previously prescribed. Encourage hydration, use of saline nasal spray, and avoidance of known allergens. Educate patient on warning signs (e.g., worsening chest pain, shortness of breath, fever, palpitations) and advise seeking emergency care if these develop. Will consider chest X-ray and/or EKG if pain persists or worsens to rule out other causes.    Immunization due Assessment & Plan: Patient educated on CDC recommendation for the vaccine. Verbal consent was obtained from the patient, vaccine administered by nurse, no sign of adverse reactions noted at this time. Patient education on arm soreness and use of tylenol  or ibuprofen for this patient   was discussed. Patient educated on the signs and symptoms of adverse effect and advise to contact the office if they occur.   Orders: -     Flu vaccine trivalent PF, 6mos and older(Flulaval,Afluria,Fluarix,Fluzone)  Asthma in adult, mild intermittent, uncomplicated -     Albuterol  Sulfate HFA; INHALE 1-2 PUFF INTO THE LUNGS EVERY 6 HOURS AS NEEDED FOR WHEEZING OR SHORTNESS OF BREATH  Dispense: 18 g; Refill: 0  IFG (impaired fasting glucose) -     Hemoglobin A1c  Vitamin D  deficiency -     VITAMIN D  25 Hydroxy (Vit-D Deficiency, Fractures)  TSH (thyroid -stimulating hormone deficiency) -     TSH + free T4  Other hyperlipidemia -     Lipid panel -     CMP14+EGFR -     CBC with Differential/Platelet  Note: This chart has been completed using Engineer, civil (consulting) software, and while attempts have been made to ensure accuracy, certain words and phrases may not be transcribed as intended.    Follow-up: Return in about 5 months (around 06/06/2024).   Keola Heninger, FNP

## 2024-01-12 ENCOUNTER — Other Ambulatory Visit: Payer: Self-pay | Admitting: Adult Health

## 2024-01-12 ENCOUNTER — Other Ambulatory Visit: Payer: Self-pay

## 2024-01-12 DIAGNOSIS — R52 Pain, unspecified: Secondary | ICD-10-CM

## 2024-01-13 LAB — CMP14+EGFR
ALT: 23 IU/L (ref 0–32)
AST: 26 IU/L (ref 0–40)
Albumin: 4.4 g/dL (ref 3.9–4.9)
Alkaline Phosphatase: 65 IU/L (ref 44–121)
BUN/Creatinine Ratio: 20 (ref 9–23)
BUN: 18 mg/dL (ref 6–24)
Bilirubin Total: 0.4 mg/dL (ref 0.0–1.2)
CO2: 22 mmol/L (ref 20–29)
Calcium: 9.3 mg/dL (ref 8.7–10.2)
Chloride: 102 mmol/L (ref 96–106)
Creatinine, Ser: 0.92 mg/dL (ref 0.57–1.00)
Globulin, Total: 2.3 g/dL (ref 1.5–4.5)
Glucose: 93 mg/dL (ref 70–99)
Potassium: 4.1 mmol/L (ref 3.5–5.2)
Sodium: 139 mmol/L (ref 134–144)
Total Protein: 6.7 g/dL (ref 6.0–8.5)
eGFR: 76 mL/min/1.73 (ref >59)

## 2024-01-13 LAB — LIPID PANEL
Chol/HDL Ratio: 3.5 ratio (ref 0.0–4.4)
Cholesterol, Total: 221 mg/dL — ABNORMAL HIGH (ref 100–199)
HDL: 64 mg/dL (ref >39)
LDL Chol Calc (NIH): 129 mg/dL — ABNORMAL HIGH (ref 0–99)
Triglycerides: 162 mg/dL — ABNORMAL HIGH (ref 0–149)
VLDL Cholesterol Cal: 28 mg/dL (ref 5–40)

## 2024-01-13 LAB — CBC WITH DIFFERENTIAL/PLATELET
Basophils Absolute: 0 x10E3/uL (ref 0.0–0.2)
Basos: 0 %
EOS (ABSOLUTE): 0.1 x10E3/uL (ref 0.0–0.4)
Eos: 1 %
Hematocrit: 41.5 % (ref 34.0–46.6)
Hemoglobin: 13.6 g/dL (ref 11.1–15.9)
Immature Grans (Abs): 0 x10E3/uL (ref 0.0–0.1)
Immature Granulocytes: 0 %
Lymphocytes Absolute: 2.7 x10E3/uL (ref 0.7–3.1)
Lymphs: 41 %
MCH: 31.4 pg (ref 26.6–33.0)
MCHC: 32.8 g/dL (ref 31.5–35.7)
MCV: 96 fL (ref 79–97)
Monocytes Absolute: 0.4 x10E3/uL (ref 0.1–0.9)
Monocytes: 6 %
Neutrophils Absolute: 3.3 x10E3/uL (ref 1.4–7.0)
Neutrophils: 52 %
Platelets: 190 x10E3/uL (ref 150–450)
RBC: 4.33 x10E6/uL (ref 3.77–5.28)
RDW: 13.2 % (ref 11.7–15.4)
WBC: 6.5 x10E3/uL (ref 3.4–10.8)

## 2024-01-13 LAB — VITAMIN D 25 HYDROXY (VIT D DEFICIENCY, FRACTURES): Vit D, 25-Hydroxy: 46.2 ng/mL (ref 30.0–100.0)

## 2024-01-13 LAB — TSH+FREE T4
Free T4: 1.15 ng/dL (ref 0.82–1.77)
TSH: 3.54 u[IU]/mL (ref 0.450–4.500)

## 2024-01-13 LAB — HEMOGLOBIN A1C
Est. average glucose Bld gHb Est-mCnc: 114 mg/dL
Hgb A1c MFr Bld: 5.6 % (ref 4.8–5.6)

## 2024-01-14 ENCOUNTER — Ambulatory Visit: Payer: Self-pay | Admitting: Family Medicine

## 2024-01-14 NOTE — Progress Notes (Signed)
 Please inform the patient: Your cholesterol levels are elevated; however, your 10-year cardiovascular risk is low at 1.2%. At this time, I recommend focusing on lifestyle changes rather than starting medication. Please work on reducing your intake of greasy, fatty, and starchy foods while increasing physical activity.

## 2024-02-13 ENCOUNTER — Other Ambulatory Visit: Payer: Self-pay | Admitting: Family Medicine

## 2024-02-13 DIAGNOSIS — R52 Pain, unspecified: Secondary | ICD-10-CM

## 2024-04-03 ENCOUNTER — Ambulatory Visit: Admitting: Adult Health

## 2024-04-03 ENCOUNTER — Encounter: Payer: Self-pay | Admitting: Adult Health

## 2024-04-03 VITALS — BP 100/69 | HR 76 | Ht 65.0 in | Wt 222.5 lb

## 2024-04-03 DIAGNOSIS — N951 Menopausal and female climacteric states: Secondary | ICD-10-CM | POA: Insufficient documentation

## 2024-04-03 DIAGNOSIS — N926 Irregular menstruation, unspecified: Secondary | ICD-10-CM

## 2024-04-03 DIAGNOSIS — R232 Flushing: Secondary | ICD-10-CM

## 2024-04-03 MED ORDER — PROGESTERONE 200 MG PO CAPS: 200.0000 mg | ORAL_CAPSULE | Freq: Every day | ORAL | 6 refills | Status: AC

## 2024-04-03 NOTE — Progress Notes (Signed)
  Subjective:     Patient ID: Holly Yang, female   DOB: 03-19-1976, 48 y.o.   MRN: [id number]  HPI Holly Yang is a 48 year old white female,single, G2P2002 in to discuss getting back on Prometrium , has been off a month, and she wants to check estrogen and testosterone level.     Component Value Date/Time   DIAGPAP  10/30/2021 1157    - Negative for intraepithelial lesion or malignancy (NILM)   DIAGPAP  02/16/2018 0000    NEGATIVE FOR INTRAEPITHELIAL LESIONS OR MALIGNANCY.   HPVHIGH Negative 10/30/2021 1157   ADEQPAP  10/30/2021 1157    Satisfactory for evaluation; transformation zone component PRESENT.   ADEQPAP  02/16/2018 0000    Satisfactory for evaluation  endocervical/transformation zone component PRESENT.   PCP is Meade Gerlach NP  Review of Systems Had fewer hot flashes on Prometrium   +skipping periods  Reviewed past medical,surgical, social and family history. Reviewed medications and allergies.     Objective:   Physical Exam BP 100/69 (BP Location: Left Arm, Patient Position: Sitting, Cuff Size: Large)   Pulse 76   Ht 5' 5 (1.651 m)   Wt 222 lb 8 oz (100.9 kg)   BMI 37.03 kg/m     Skin warm and dry.  Lungs: clear to ausculation bilaterally. Cardiovascular: regular rate and rhythm. Fall risk is moderate  Upstream - 04/03/24 1418       Pregnancy Intention Screening   Does the patient want to become pregnant in the next year? No    Does the patient's partner want to become pregnant in the next year? No    Would the patient like to discuss contraceptive options today? No      Contraception Wrap Up   Current Method Abstinence    End Method Abstinence    Contraception Counseling Provided No           Assessment:     1. Perimenopause (Primary) Will refill Prometrium   Meds ordered this encounter  Medications   progesterone  (PROMETRIUM ) 200 MG capsule    Sig: Take 1 capsule (200 mg total) by mouth daily.    Dispense:  30 capsule    Refill:  6     Supervising Provider:   JAYNE VONN DEL [[id number]]   Will check estradiol  and testosterone at her request  - Estradiol  - Testosterone,Free and Total  2. Irregular periods Skipping periods   3. Hot flashes better    Plan:     Follow up in 6 months for pap and physical

## 2024-04-04 ENCOUNTER — Ambulatory Visit: Payer: Self-pay | Admitting: Adult Health

## 2024-04-04 LAB — TESTOSTERONE,FREE AND TOTAL
Testosterone, Free: 2.9 pg/mL (ref 0.0–4.2)
Testosterone: 14 ng/dL (ref 4–50)

## 2024-04-04 LAB — ESTRADIOL: Estradiol: 5.8 pg/mL

## 2024-04-05 ENCOUNTER — Other Ambulatory Visit: Payer: Self-pay | Admitting: Adult Health

## 2024-04-05 MED ORDER — ESTRADIOL 0.0375 MG/24HR TD PTTW: 1.0000 | MEDICATED_PATCH | TRANSDERMAL | 3 refills | Status: AC

## 2024-04-05 NOTE — Progress Notes (Signed)
 Rx vivelle  dot 0.0375 mg

## 2024-06-06 ENCOUNTER — Ambulatory Visit: Admitting: Nurse Practitioner

## 2024-06-06 ENCOUNTER — Encounter: Payer: Self-pay | Admitting: Nurse Practitioner

## 2024-06-06 VITALS — BP 135/77 | HR 59 | Ht 65.0 in | Wt 207.1 lb

## 2024-06-06 DIAGNOSIS — M7660 Achilles tendinitis, unspecified leg: Secondary | ICD-10-CM

## 2024-06-06 DIAGNOSIS — R52 Pain, unspecified: Secondary | ICD-10-CM

## 2024-06-06 DIAGNOSIS — G43009 Migraine without aura, not intractable, without status migrainosus: Secondary | ICD-10-CM

## 2024-06-06 DIAGNOSIS — Z6834 Body mass index (BMI) 34.0-34.9, adult: Secondary | ICD-10-CM

## 2024-06-06 DIAGNOSIS — J4520 Mild intermittent asthma, uncomplicated: Secondary | ICD-10-CM

## 2024-06-06 DIAGNOSIS — K580 Irritable bowel syndrome with diarrhea: Secondary | ICD-10-CM

## 2024-06-06 DIAGNOSIS — F5104 Psychophysiologic insomnia: Secondary | ICD-10-CM

## 2024-06-06 DIAGNOSIS — K219 Gastro-esophageal reflux disease without esophagitis: Secondary | ICD-10-CM

## 2024-06-06 MED ORDER — SUMATRIPTAN SUCCINATE 50 MG PO TABS: 50.0000 mg | ORAL_TABLET | ORAL | 5 refills | Status: AC | PRN

## 2024-06-06 MED ORDER — PROPRANOLOL HCL ER 60 MG PO CP24: 60.0000 mg | ORAL_CAPSULE | Freq: Every day | ORAL | 1 refills | Status: AC

## 2024-06-06 NOTE — Patient Instructions (Signed)
 Very Bad Headache With a Trigger (Migraine Headache): What to Know  A migraine headache is a very strong throbbing pain on one or both sides of your head. This type of headache can also cause other symptoms. It can last from 4 hours to 3 days. Talk with your doctor about what things may bring on (trigger) this condition. What are the causes? The exact cause of a migraine is not known. This condition may be brought on or caused by: Smoking. Medicines, such as: Medicine used to treat chest pain (nitroglycerin). Birth control pills. Estrogen. Some blood pressure medicines. Certain substances in some foods or drinks. Foods and drinks, such as: Cheese. Chocolate. Alcohol. Caffeine. Doing physical activity that is very hard. Other things that may trigger a migraine headache include: Periods. Pregnancy. Hunger. Stress. Getting too much or too little sleep. Weather changes. Feeling tired (fatigue). What increases the risk? Being 57-81 years old. Being female. Having a family history of migraine headaches. Being Caucasian. Having a mental health condition, such as being sad (depressed) or feeling worried or nervous (anxious). Being very overweight (obese). What are the signs or symptoms? A throbbing pain. This pain may: Happen in any area of the head, such as on one or both sides. Make it hard to do daily activities. Get worse with physical activity. Get worse around bright lights, loud noises, or smells. Other symptoms may include: Feeling like you may vomit (nauseous). Vomiting. Dizziness. Before a migraine headache starts, you may get warning signs (an aura). An aura may include: Seeing flashing lights or having blind spots. Seeing bright spots, halos, or zigzag lines. Having tunnel vision or blurred vision. Having numbness or a tingling feeling. Having trouble talking. Having weak muscles. After a migraine ends, you may have symptoms. These may  include: Tiredness. Trouble thinking (concentrating). How is this treated? Taking medicines that: Relieve pain. Relieve the feeling like you may vomit. Prevent migraine headaches. Treatment may also include: Acupuncture. Lifestyle changes like avoiding foods that bring on migraine headaches. Learning ways to control your body functions (biofeedback). Therapy to help you know and deal with negative thoughts (cognitive behavioral therapy). Follow these instructions at home: Medicines Take over-the-counter and prescription medicines only as told by your doctor. If told, take steps to prevent problems with pooping (constipation). You may need to: Drink enough fluid to keep your pee (urine) pale yellow. Take medicines. You will be told what medicines to take. Eat foods that are high in fiber. These include beans, whole grains, and fresh fruits and vegetables. Limit foods that are high in fat and sugar. These include fried or sweet foods. Ask your doctor if you should avoid driving or using machines while you are taking your medicine. Lifestyle  Do not drink alcohol. Do not smoke or use any products that contain nicotine or tobacco. If you need help quitting, ask your doctor. Get 7-9 hours of sleep each night, or the amount recommended by your doctor. Find ways to deal with stress, such as meditation, deep breathing, or yoga. Try to exercise often. This can help lessen how bad and how often your migraines happen. General instructions Keep a journal to find out what may bring on your migraine headaches. This can help you avoid those things. For example, write down: What you eat and drink. How much sleep you get. Any change to your medicines or diet. If you have a migraine headache: Avoid things that make your symptoms worse, such as bright lights. Lie down in a dark,  quiet room. Do not drive or use machinery. Ask your doctor what activities are safe for you. Where to find more  information Coalition for Headache and Migraine Patients (CHAMP): headachemigraine.org American Migraine Foundation: americanmigrainefoundation.org National Headache Foundation: headaches.org Contact a doctor if: You get a migraine headache that is different or worse than others you have had. You have more than 15 days of headaches in one month. Get help right away if: Your migraine headache gets very bad. Your migraine headache lasts more than 72 hours. You have a fever or stiff neck. You have trouble seeing. Your muscles feel weak or like you cannot control them. You lose your balance a lot. You have trouble walking. You faint. You have a seizure. This information is not intended to replace advice given to you by your health care provider. Make sure you discuss any questions you have with your health care provider. Document Revised: 02/28/2024 Document Reviewed: 12/14/2021 Elsevier Patient Education  2025 [employer].

## 2024-06-06 NOTE — Progress Notes (Signed)
 "  Subjective:    Patient ID: Holly Yang, female    DOB: 06/14/1975, 49 y.o.   MRN: [id number]   Chief Complaint: Medical Management of Chronic Issues (Follow up/ Needs Imitrex  refilled)    HPI:  Holly Yang is a 49 y.o. who identifies as a female who was assigned female at birth.   Social history: Lives with: by herself- kids are home on weekends Work history: from home   Comes in today for follow up of the following chronic medical issues:  1. Migraine without aura and without status migrainosus, not intractable Migraines tend to come in clusters- may have 4 in one week then not have for several weeks Traveling for work increase incidents Uses imitrex  when needed Tried topamax  in the past- made her feel terrible  2. Asthma in adult, mild intermittent, uncomplicated No recent flare ups  3. GERD without esophagitis Takes wonder belly OTC and that helps  4. Irritable bowel syndrome with diarrhea No recent flare ups  5. Achilles tendinitis, unspecified laterality Is on mobic  daily and is doing well  6. Chronic insomnia No recent issues  7. Class 2 obesity due to excess calories without serious comorbidity with body mass index (BMI) of 38.0 to 38.9 in adult Weight is down 15lbs Wt Readings from Last 3 Encounters:  06/06/24 207 lb 1.3 oz (93.9 kg)  04/03/24 222 lb 8 oz (100.9 kg)  09/06/23 236 lb 8 oz (107.3 kg)   BMI Readings from Last 3 Encounters:  06/06/24 34.46 kg/m  04/03/24 37.03 kg/m  09/06/23 39.36 kg/m      New complaints: None today  Allergies[1] Outpatient Encounter Medications as of 06/06/2024  Medication Sig   albuterol  (VENTOLIN  HFA) 108 (90 Base) MCG/ACT inhaler INHALE 1-2 PUFF INTO THE LUNGS EVERY 6 HOURS AS NEEDED FOR WHEEZING OR SHORTNESS OF BREATH   estradiol  (VIVELLE -DOT) 0.0375 MG/24HR Place 1 patch onto the skin 2 (two) times a week.   meloxicam  (MOBIC ) 7.5 MG tablet TAKE 1 TABLET BY MOUTH EVERY DAY AS NEEDED FOR PAIN    progesterone  (PROMETRIUM ) 200 MG capsule Take 1 capsule (200 mg total) by mouth daily.   SUMAtriptan  (IMITREX ) 50 MG tablet Take 1 tablet (50 mg total) by mouth as needed for migraine. May repeat in 2 hours if headache persists or recurs.   No facility-administered encounter medications on file as of 06/06/2024.    Past Surgical History:  Procedure Laterality Date   CESAREAN SECTION  2007, 2010   [place], [place], [place] & [place]  [place], [place], [place]   EXCISION OF SKIN TAG N/A 04/11/2013   Procedure: EXCISION OF EXTERNAL HEMORRHOIDAL SKIN TAGS;  Surgeon: Oneil DELENA Budge, MD;  Location: [facility];  Service: General;  Laterality: N/A;   WISDOM TOOTH EXTRACTION  1996    Family History  Problem Relation Age of Onset   Cancer Paternal Grandfather        skin,liver   Stroke Paternal Grandmother    Arthritis Maternal Grandmother    Other Maternal Grandfather        hemochromatosis   Other Father 47       Amyloidosis   Congestive Heart Failure Mother    Heart disease Mother    Hypertension Mother       Controlled substance contract: n/a      Review of Systems  Constitutional:  Negative for diaphoresis.  Eyes:  Negative for pain.  Respiratory:  Negative for shortness of breath.   Cardiovascular:  Negative for  chest pain, palpitations and leg swelling.  Gastrointestinal:  Negative for abdominal pain.  Endocrine: Negative for polydipsia.  Skin:  Negative for rash.  Neurological:  Negative for dizziness, weakness and headaches.  Hematological:  Does not bruise/bleed easily.  All other systems reviewed and are negative.      Objective:   Physical Exam Vitals and nursing note reviewed.  Constitutional:      General: She is not in acute distress.    Appearance: Normal appearance. She is well-developed.  HENT:     Head: Normocephalic.     Right Ear: Tympanic membrane normal.     Left Ear: Tympanic membrane normal.     Nose: Nose normal.     Mouth/Throat:      Mouth: Mucous membranes are moist.  Eyes:     Pupils: Pupils are equal, round, and reactive to light.  Neck:     Vascular: No carotid bruit or JVD.  Cardiovascular:     Rate and Rhythm: Normal rate and regular rhythm.     Heart sounds: Normal heart sounds.  Pulmonary:     Effort: Pulmonary effort is normal. No respiratory distress.     Breath sounds: Normal breath sounds. No wheezing or rales.  Chest:     Chest wall: No tenderness.  Abdominal:     General: Bowel sounds are normal. There is no distension or abdominal bruit.     Palpations: Abdomen is soft. There is no hepatomegaly, splenomegaly, mass or pulsatile mass.     Tenderness: There is no abdominal tenderness.  Musculoskeletal:        General: Normal range of motion.     Cervical back: Normal range of motion and neck supple.  Lymphadenopathy:     Cervical: No cervical adenopathy.  Skin:    General: Skin is warm and dry.  Neurological:     Mental Status: She is alert and oriented to person, place, and time.     Deep Tendon Reflexes: Reflexes are normal and symmetric.  Psychiatric:        Behavior: Behavior normal.        Thought Content: Thought content normal.        Judgment: Judgment normal.     BP 135/77   Pulse (!) 59   Ht 5' 5 (1.651 m)   Wt 207 lb 1.3 oz (93.9 kg)   SpO2 98%   BMI 34.46 kg/m        Assessment & Plan:   Holly Yang comes in today with chief complaint of Medical Management of Chronic Issues (Follow up/ Needs Imitrex  refilled)   Diagnosis and orders addressed:  1. Migraine without aura and without status migrainosus, not intractable Added propranolol  to see if helps Avoid caffeine - SUMAtriptan  (IMITREX ) 50 MG tablet; Take 1 tablet (50 mg total) by mouth as needed for migraine. May repeat in 2 hours if headache persists or recurs.  Dispense: 20 tablet; Refill: 5 - propranolol  ER (INDERAL  LA) 60 MG 24 hr capsule; Take 1 capsule (60 mg total) by mouth daily.  Dispense: 90 capsule;  Refill: 1  2. Asthma in adult, mild intermittent, uncomplicated (Primary) Report any bleeding issues  3. GERD without esophagitis Avoid spicy foods Do not eat 2 hours prior to bedtime   4. Irritable bowel syndrome with diarrhea Watch diet to avoid flare ups  5. Achilles tendinitis, unspecified laterality Continue mobic  as needed  6. Chronic insomnia Bedtime routine  7. BMI 34.0-34.9,adult Discussed diet and exercise for person  with BMI >25 Will recheck weight in 3-6 months    Labs pending Health Maintenance reviewed Diet and exercise encouraged  Follow up plan: prn   Mary-Mekisha Gladis, FNP     [1]  Allergies Allergen Reactions   Topamax  [Topiramate ]     Gave increase headache   "
